# Patient Record
Sex: Female | Born: 1955 | Hispanic: Yes | Smoking: Never smoker
Health system: Southern US, Community
[De-identification: ages and names within clinical notes are randomized; demographics above are authoritative.]

## PROBLEM LIST (undated history)

## (undated) DIAGNOSIS — N200 Calculus of kidney: Secondary | ICD-10-CM

## (undated) DIAGNOSIS — K8020 Calculus of gallbladder without cholecystitis without obstruction: Secondary | ICD-10-CM

## (undated) DIAGNOSIS — G43909 Migraine, unspecified, not intractable, without status migrainosus: Secondary | ICD-10-CM

## (undated) HISTORY — DX: Calculus of gallbladder without cholecystitis without obstruction: K80.20

## (undated) HISTORY — DX: Calculus of kidney: N20.0

## (undated) HISTORY — DX: Migraine, unspecified, not intractable, without status migrainosus: G43.909

## (undated) HISTORY — PX: KIDNEY STONE SURGERY: SHX686

## (undated) HISTORY — PX: CHOLECYSTECTOMY: SHX55

## (undated) HISTORY — PX: LITHOTRIPSY: SUR834

## (undated) HISTORY — PX: BREAST SURGERY: SHX581

---

## 2019-06-22 ENCOUNTER — Other Ambulatory Visit: Payer: Self-pay

## 2019-06-23 ENCOUNTER — Ambulatory Visit (INDEPENDENT_AMBULATORY_CARE_PROVIDER_SITE_OTHER): Payer: BC Managed Care – PPO | Admitting: Family Medicine

## 2019-06-23 ENCOUNTER — Encounter: Payer: Self-pay | Admitting: Family Medicine

## 2019-06-23 VITALS — BP 126/79 | HR 87 | Temp 98.9°F | Ht 62.6 in | Wt 145.4 lb

## 2019-06-23 DIAGNOSIS — Z01411 Encounter for gynecological examination (general) (routine) with abnormal findings: Secondary | ICD-10-CM

## 2019-06-23 DIAGNOSIS — Z7689 Persons encountering health services in other specified circumstances: Secondary | ICD-10-CM

## 2019-06-23 DIAGNOSIS — Z124 Encounter for screening for malignant neoplasm of cervix: Secondary | ICD-10-CM | POA: Diagnosis not present

## 2019-06-23 DIAGNOSIS — Z01419 Encounter for gynecological examination (general) (routine) without abnormal findings: Secondary | ICD-10-CM

## 2019-06-23 DIAGNOSIS — Z114 Encounter for screening for human immunodeficiency virus [HIV]: Secondary | ICD-10-CM | POA: Diagnosis not present

## 2019-06-23 DIAGNOSIS — Z1321 Encounter for screening for nutritional disorder: Secondary | ICD-10-CM

## 2019-06-23 DIAGNOSIS — E663 Overweight: Secondary | ICD-10-CM

## 2019-06-23 DIAGNOSIS — Z1159 Encounter for screening for other viral diseases: Secondary | ICD-10-CM

## 2019-06-23 DIAGNOSIS — K089 Disorder of teeth and supporting structures, unspecified: Secondary | ICD-10-CM | POA: Diagnosis not present

## 2019-06-23 DIAGNOSIS — Z130 Encounter for screening for diseases of the blood and blood-forming organs and certain disorders involving the immune mechanism: Secondary | ICD-10-CM

## 2019-06-23 DIAGNOSIS — Z1211 Encounter for screening for malignant neoplasm of colon: Secondary | ICD-10-CM

## 2019-06-23 DIAGNOSIS — Z1231 Encounter for screening mammogram for malignant neoplasm of breast: Secondary | ICD-10-CM

## 2019-06-23 NOTE — Progress Notes (Signed)
Alyssa Walters is a 64 y.o. female presents to office today for annual physical exam examination.    In person [facility] Spanish interpreter Delfino Lovett present for today's exam.  Concerns today include:  Patient is an immigrant from [place].  She has not had any preventative care in several years now.  She has a history of migraine headaches as a juvenile but has not had any recurrent issues.  She does have a history of renal stones that have required lithotripsy in the past.  Additionally, last colonoscopy was greater than 5 years ago at that time was an incomplete colonoscopy due to difficulty passing through the bowel fully.  She would like to have this reordered.  Occupation: works in a factory in [place], Marital status: single, Substance use: none Diet: balanced, Exercise: active a work Last colonoscopy: >5 years ago in [place] Last mammogram: >5 years ago; had a cyst drained previously in left breast Last pap smear: >5 years ago, no abnormals Refills needed today: n/a Immunizations needed: unknown.    There is no immunization history on file for this patient.   Past Medical History:  Diagnosis Date  . Gallbladder stone without cholecystitis or obstruction   . Kidney stone   . Migraines   . Renal stone    Social History   Socioeconomic History  . Marital status: Divorced    Spouse name: Not on file  . Number of children: 5  . Years of education: Not on file  . Highest education level: Not on file  Occupational History  . Not on file  Tobacco Use  . Smoking status: Passive Smoke Exposure - Never Smoker  . Smokeless tobacco: Never Used  Substance and Sexual Activity  . Alcohol use: Yes    Alcohol/week: 1.0 standard drinks    Types: 1 Glasses of wine per week  . Drug use: Never  . Sexual activity: Not Currently  Other Topics Concern  . Not on file  Social History Narrative   Moved from [place].  Resides in [place].   Has 5 children, 1 lives in [place] and works as a  Tax adviser.   1 daughter in [place]   Social Determinants of Health   Financial Resource Strain:   . Difficulty of Paying Living Expenses:   Food Insecurity:   . Worried About Charity fundraiser in the Last Year:   . Arboriculturist in the Last Year:   Transportation Needs:   . Film/video editor (Medical):   Marland Kitchen Lack of Transportation (Non-Medical):   Physical Activity:   . Days of Exercise per Week:   . Minutes of Exercise per Session:   Stress:   . Feeling of Stress :   Social Connections:   . Frequency of Communication with Friends and Family:   . Frequency of Social Gatherings with Friends and Family:   . Attends Religious Services:   . Active Member of Clubs or Organizations:   . Attends Archivist Meetings:   Marland Kitchen Marital Status:   Intimate Partner Violence:   . Fear of Current or Ex-Partner:   . Emotionally Abused:   Marland Kitchen Physically Abused:   . Sexually Abused:    Past Surgical History:  Procedure Laterality Date  . BREAST SURGERY Left    cyst drained  . CESAREAN SECTION    . CHOLECYSTECTOMY    . LITHOTRIPSY     Family History  Problem Relation Age of Onset  . Cancer Mother   .  Arthritis Mother   . Kidney disease Father   . Cancer Sister   . Clotting disorder Brother    No current outpatient medications on file.  No Known Allergies   ROS: Review of Systems Constitutional: negative Eyes: negative Ears, nose, mouth, throat, and face: negative Respiratory: negative Cardiovascular: negative Gastrointestinal: negative Genitourinary:external vaginal itching, admits to shaving Integument/breast: has spider veins legs, discoloration along RUQ Hematologic/lymphatic: negative Musculoskeletal:negative Neurological: negative Behavioral/Psych: negative Endocrine: negative Allergic/Immunologic: negative    Physical exam BP 126/79   Pulse 87   Temp 98.9 F (37.2 C) (Temporal)   Ht 5' 2.6" (1.59 m)   Wt 145 lb 6.4 oz (66 kg)   SpO2  96%   BMI 26.09 kg/m  General appearance: alert, cooperative, appears stated age and no distress Head: Normocephalic, without obvious abnormality, atraumatic Eyes: negative findings: lids and lashes normal and conjunctivae and sclerae normal Ears: normal TM's and external ear canals both ears Nose: Nares normal. Septum midline. Mucosa normal. No drainage or sinus tenderness. Throat: lips, mucosa, and tongue normal; poor dentition Neck: no adenopathy, supple, symmetrical, trachea midline and thyroid not enlarged, symmetric, no tenderness/mass/nodules Back: symmetric, no curvature. ROM normal. No CVA tenderness. Lungs: clear to auscultation bilaterally and normal WOB on room air Heart: regular rate and rhythm, S1, S2 normal, no murmur, click, rub or gallop Abdomen: soft, non-tender; bowel sounds normal; no masses,  no organomegaly Extremities: extremities normal, atraumatic, no cyanosis or edema Pulses: 2+ and symmetric Skin: She does have slight hyperpigmentation noted in the right upper quadrant of the abdomen.  Small varicose veins noted throughout bilateral lower extremities up to thighs. Lymph nodes: Cervical, supraclavicular, and axillary nodes normal. Neurologic: Alert and oriented X 3, normal strength and tone. Normal symmetric reflexes. Normal coordination and gait  Psych: Mood stable, speech normal, affect appropriate, pleasant and interactive. Depression screen [facility] 2/9 06/23/2019  Decreased Interest 0  Down, Depressed, Hopeless 0  PHQ - 2 Score 0  Altered sleeping 1  Tired, decreased energy 1  Change in appetite 0  Feeling bad or failure about yourself  2  Trouble concentrating 0  Moving slowly or fidgety/restless 0  Suicidal thoughts 0  PHQ-9 Score 4    Assessment/ Plan: [name] here for annual physical exam.   1. Well woman exam with routine gynecological exam  2. Poor dentition Seeing dentistry  3. Establishing care with new doctor, encounter for No previous  records available for review  4. Overweight (BMI 25.0-29.9) nonfasting labs ordered - CMP14+EGFR - TSH - Lipid Panel  5. Screening for cervical cancer - Pap IG, CT/NG NAA, and HPV (high risk) [facility]  6. Screening, anemia, deficiency, iron - CBC  7. Screening for HIV (human immunodeficiency virus) - HIV antibody (with reflex)  8. Encounter for hepatitis C screening test for low risk patient - Hepatitis C antibody  9. Encounter for vitamin deficiency screening - VITAMIN D 25 Hydroxy (Vit-D Deficiency, Fractures)  10. Screen for colon cancer - Ambulatory referral to Gastroenterology  11. Encounter for screening mammogram for malignant neoplasm of breast - MM Digital Screening; Future   Counseled on healthy lifestyle choices, including diet (rich in fruits, vegetables and lean meats and low in salt and simple carbohydrates) and exercise (at least 30 minutes of moderate physical activity daily).  Total time spent with patient 58 minutes.  Greater than 50% of encounter spent in coordination of care/counseling.   Maryori Weide M. Lajuana Ripple, DO

## 2019-06-23 NOTE — Patient Instructions (Signed)
You had labs performed today.  You will be contacted with the results of the labs once they are available, usually in the next 3 business days for routine lab work.  If you have an active my chart account, they will be released to your MyChart.  If you prefer to have these labs released to you via telephone, please let us know.  If you had a pap smear or biopsy performed, expect to be contacted in about 7-10 days.   Cuidados preventivos en las mujeres de 13 a 32 aos de edad Preventive Care 64-91 Years Old, Female Los cuidados preventivos hacen referencia a las opciones en cuanto a las visitas al mdico y al estilo de vida, las cuales pueden promover la salud y Musician. Esto puede comprender lo siguiente:  Un examen fsico anual. Esto tambin se puede llamar control de bienestar anual.  Visitas regulares al dentista y exmenes oculares.  Vacunas.  Estudios para Engineer, building services.  Opciones saludables de estilo de vida, como seguir una dieta saludable, hacer ejercicio regularmente, no usar drogas ni productos que contengan nicotina y tabaco, y limitar el consumo de alcohol. Qu puedo esperar para mi visita de cuidado preventivo? Examen fsico El mdico revisar lo siguiente:  IT consultant y [place]. Esto se puede usar para calcular el ndice de masa corporal ([place]), que indica si tiene un peso saludable.  Frecuencia cardaca y presin arterial.  Piel para detectar manchas anormales. Asesoramiento Su mdico puede preguntarle acerca de:  Consumo de tabaco, alcohol y drogas.  Su bienestar emocional.  El bienestar en el hogar y sus relaciones personales.  Su actividad sexual.  Sus hbitos de alimentacin.  Su trabajo y [place] laboral.  Mtodos anticonceptivos.  Su ciclo menstrual.  Sus antecedentes de Media planner. Qu vacunas necesito?  [place] antigripal  Se recomienda aplicarse esta vacuna todos los [place]. Vacuna contra el ttanos, difteria y tos ferina  (Tdap)  Es posible que tenga que aplicarse un refuerzo contra el ttanos y la difteria (DT) cada 10aos. Vacuna contra la varicela  Es posible que tenga que aplicrsela si no recibi esta vacuna. Vacuna contra el herpes zster (culebrilla)  Es posible que la necesite despus de los 87 aos de [place]. Vacuna contra el sarampin, rubola y paperas (SRP)  Es posible que necesite aplicarse al menos una dosis de la vacuna SRP si naci despus de [id number]. Podra tambin necesitar una segunda dosis. Vacuna antineumoccica conjugada (PCV13)  Puede necesitar esta vacuna si tiene determinadas enfermedades y no se vacun anteriormente. [place] antineumoccica de polisacridos (PPSV23)  Quizs tenga que aplicarse una o dos dosis si fuma o si tiene determinadas afecciones. [place] antimeningoccica conjugada (MenACWY)  Puede necesitar esta vacuna si tiene determinadas afecciones. Vacuna contra la hepatitis A  Es posible que necesite esta vacuna si tiene ciertas afecciones o si viaja o trabaja en lugares en los que podra estar expuesta a la hepatitis A. Vacuna contra la hepatitis B  Es posible que necesite esta vacuna si tiene ciertas afecciones o si viaja o trabaja en lugares en los que podra estar expuesta a la hepatitis B. Vacuna antihaemophilus influenzae tipo B (Hib)  Puede necesitar esta vacuna si tiene determinadas afecciones. Vacuna contra el virus del papiloma humano (VPH)  Si el mdico se lo recomienda, Research scientist (physical sciences) tres dosis a lo largo de 6 meses. Puede recibir las vacunas en forma de dosis individuales o en forma de dos o ms vacunas juntas en la misma inyeccin (vacunas combinadas). Hable con su mdico  sobre los riesgos y [place] vacunas combinadas. Qu pruebas necesito? Anlisis de [employer] de lpidos y colesterol. Estos se pueden verificar cada 64 aos o, con ms frecuencia, si usted tiene ms de 11 aos de edad.  Anlisis de hepatitisC.  Anlisis de  hepatitisB. Pruebas de deteccin  Pruebas de deteccin de cncer de pulmn. Es posible que se le realice esta prueba de deteccin a partir de los 64 aos de edad, si ha fumado durante 30 aos un paquete diario y sigue fumando o dej el hbito en algn momento en los ltimos 15 aos.  Pruebas de Programme researcher, broadcasting/film/video de Surveyor, minerals. Todos los adultos a partir de los 64 aos de edad y [place] 55 aos de edad deben hacerse esta prueba de deteccin. El mdico puede recomendarle las pruebas de deteccin a partir de los 86 aos de edad si corre un mayor riesgo. Le realizarn pruebas cada 1 a 64 aos, segn los [place] y el tipo de prueba de Programme researcher, broadcasting/film/video.  Pruebas de deteccin de la diabetes. Esto se Set designer un control del azcar en la sangre (glucosa) despus de no haber comido durante un periodo de tiempo (ayuno). Es posible que se le realice esta prueba cada 1 a 3 aos.  Mamografa. Se puede realizar cada 1 o 2 aos. Hable con su mdico sobre cundo debe comenzar a Engineer, manufacturing de [place] regular. Esto depende de si tiene antecedentes familiares de cncer de mama o no.  Pruebas de deteccin de cncer relacionado con las mutaciones del BRCA. Es posible que se las deba realizar si tiene antecedentes de cncer de mama, de ovario, de trompas o peritoneal.  Examen plvico y prueba de Papanicolaou. Esto se puede realizar cada 34aos a Renato Gails de los 21aos de edad. A partir de los 30 aos, esto se puede Optometrist cada 64 aos si usted se realiza una prueba de Papanicolaou en combinacin con una prueba de deteccin del virus del papiloma humano (VPH). Otras pruebas  Anlisis de enfermedades de transmisin sexual (ETS).  Densitometra sea. Esto se realiza para detectar osteoporosis. Se le puede realizar este examen de deteccin si tiene un riesgo alto de tener osteoporosis. Siga estas instrucciones en su casa: Comida y bebida  Siga una dieta que incluya frutas frescas y verduras, cereales  integrales, lcteos descremados y protenas magras.  Tome los suplementos vitamnicos y [employer] se lo haya indicado el mdico.  No beba alcohol si: ? Su mdico le indica no hacerlo. ? Est embarazada, puede estar embarazada o est tratando de quedar embarazada.  Si bebe alcohol: ? Limite la cantidad que consume de 0 a 1 medida por da. ? Est atenta a la cantidad de alcohol que hay en las bebidas que toma. En los [place], una medida equivale a una botella de cerveza de 12oz (346m), un vaso de vino de 5oz (1459m o un vaso de una bebida alcohlica de alta graduacin de 1oz (4455m Estilo de vidNavistar International Corporationlas encas a diario.  Mantngase activa. Haga al menos 41m45mos de ejercicio 5o ms das Hilton Hotelso consuma ningn producto que contenga nicotina o tabaco, como cigarrillos, cigarrillos electrnicos y tabaco de mascHigher education careers adviser necesita ayuda para dejar de fumar, consulte al mdico.  Si es sexualmente activa, practique sexo seguro. Use un condn u otra forma de mtodo anticonceptivo (anticonceptivos) a fin de evitEnvironmental health practitionerTS (infecciones de transmisin sexual).  Si el mdico se lo indic, tome una  dosis baja de aspirina diariamente a partir de los 73 aos de [place]. Cundo volver?  Visite al mdico una vez al ao para una visita de control.  Pregntele al mdico con qu frecuencia debe realizarse un control de la vista y los dientes.  Mantenga su esquema de vacunacin al da. Esta informacin no tiene Marine scientist el consejo del mdico. Asegrese de hacerle al mdico cualquier pregunta que tenga. Document Revised: 01/13/2018 Document Reviewed: 01/13/2018 Elsevier Patient Education  [place].

## 2019-06-24 LAB — LIPID PANEL
Chol/HDL Ratio: 3.2 ratio (ref 0.0–4.4)
Cholesterol, Total: 189 mg/dL (ref 100–199)
HDL: 60 mg/dL (ref >39)
LDL Chol Calc (NIH): 105 mg/dL — ABNORMAL HIGH (ref 0–99)
Triglycerides: 139 mg/dL (ref 0–149)
VLDL Cholesterol Cal: 24 mg/dL (ref 5–40)

## 2019-06-24 LAB — CMP14+EGFR
ALT: 18 IU/L (ref 0–32)
AST: 22 IU/L (ref 0–40)
Albumin/Globulin Ratio: 1.8 (ref 1.2–2.2)
Albumin: 4.8 g/dL (ref 3.8–4.8)
Alkaline Phosphatase: 111 IU/L (ref 39–117)
BUN/Creatinine Ratio: 12 (ref 12–28)
BUN: 12 mg/dL (ref 8–27)
Bilirubin Total: 0.3 mg/dL (ref 0.0–1.2)
CO2: 24 mmol/L (ref 20–29)
Calcium: 9.9 mg/dL (ref 8.7–10.3)
Chloride: 103 mmol/L (ref 96–106)
Creatinine, Ser: 1.01 mg/dL — ABNORMAL HIGH (ref 0.57–1.00)
GFR calc Af Amer: 68 mL/min/{1.73_m2} (ref >59)
GFR calc non Af Amer: 59 mL/min/{1.73_m2} — ABNORMAL LOW (ref >59)
Globulin, Total: 2.6 g/dL (ref 1.5–4.5)
Glucose: 94 mg/dL (ref 65–99)
Potassium: 4 mmol/L (ref 3.5–5.2)
Sodium: 142 mmol/L (ref 134–144)
Total Protein: 7.4 g/dL (ref 6.0–8.5)

## 2019-06-24 LAB — HIV ANTIBODY (ROUTINE TESTING W REFLEX): HIV Screen 4th Generation wRfx: NONREACTIVE

## 2019-06-24 LAB — CBC
Hematocrit: 40.7 % (ref 34.0–46.6)
Hemoglobin: 13.4 g/dL (ref 11.1–15.9)
MCH: 27.9 pg (ref 26.6–33.0)
MCHC: 32.9 g/dL (ref 31.5–35.7)
MCV: 85 fL (ref 79–97)
Platelets: 284 10*3/uL (ref 150–450)
RBC: 4.81 x10E6/uL (ref 3.77–5.28)
RDW: 13 % (ref 11.7–15.4)
WBC: 6.1 10*3/uL (ref 3.4–10.8)

## 2019-06-24 LAB — HEPATITIS C ANTIBODY: Hep C Virus Ab: <0.1 s/co ratio (ref 0.0–0.9)

## 2019-06-24 LAB — VITAMIN D 25 HYDROXY (VIT D DEFICIENCY, FRACTURES): Vit D, 25-Hydroxy: 31.3 ng/mL (ref 30.0–100.0)

## 2019-06-24 LAB — TSH: TSH: 1.32 u[IU]/mL (ref 0.450–4.500)

## 2019-06-25 ENCOUNTER — Encounter: Payer: Self-pay | Admitting: Family Medicine

## 2019-06-27 LAB — IGP,CTNG,APTIMAHPV
Chlamydia, Nuc. Acid Amp: NEGATIVE
Gonococcus by Nucleic Acid Amp: NEGATIVE
HPV Aptima: NEGATIVE

## 2019-06-28 NOTE — Progress Notes (Signed)
Lmtcb.

## 2019-06-29 ENCOUNTER — Telehealth: Payer: Self-pay | Admitting: Family Medicine

## 2019-06-29 NOTE — Telephone Encounter (Signed)
Is it alright to schedule a work in appt?

## 2019-06-29 NOTE — Telephone Encounter (Signed)
Yes. Totally ok to schedule.  Unfortunately her appointment ran almost an hour and she had other issues were a higher priority so absolutely glad to take care of this if she would like to come in.

## 2019-06-30 NOTE — Telephone Encounter (Signed)
Message left on voice mail.   Patient needs a follow up appointment scheduled to have ears cleaned.  Please call to schedule.

## 2019-07-03 ENCOUNTER — Encounter: Payer: Self-pay | Admitting: Internal Medicine

## 2019-07-17 ENCOUNTER — Ambulatory Visit (INDEPENDENT_AMBULATORY_CARE_PROVIDER_SITE_OTHER): Payer: BC Managed Care – PPO | Admitting: Family Medicine

## 2019-07-17 ENCOUNTER — Encounter: Payer: Self-pay | Admitting: Family Medicine

## 2019-07-17 ENCOUNTER — Other Ambulatory Visit: Payer: Self-pay

## 2019-07-17 VITALS — BP 129/88 | HR 76 | Temp 97.5°F | Ht 63.0 in | Wt 146.0 lb

## 2019-07-17 DIAGNOSIS — H9313 Tinnitus, bilateral: Secondary | ICD-10-CM

## 2019-07-17 DIAGNOSIS — M25511 Pain in right shoulder: Secondary | ICD-10-CM

## 2019-07-17 DIAGNOSIS — R7989 Other specified abnormal findings of blood chemistry: Secondary | ICD-10-CM

## 2019-07-17 DIAGNOSIS — R21 Rash and other nonspecific skin eruption: Secondary | ICD-10-CM

## 2019-07-17 DIAGNOSIS — G8929 Other chronic pain: Secondary | ICD-10-CM

## 2019-07-17 DIAGNOSIS — M25512 Pain in left shoulder: Secondary | ICD-10-CM

## 2019-07-17 DIAGNOSIS — R682 Dry mouth, unspecified: Secondary | ICD-10-CM

## 2019-07-17 MED ORDER — TRIAMCINOLONE ACETONIDE 0.1 % EX CREA: 1.0000 "application " | TOPICAL_CREAM | Freq: Two times a day (BID) | CUTANEOUS | 0 refills | Status: DC

## 2019-07-17 NOTE — Patient Instructions (Signed)
Asegrese de llegar al menos 15 minutos antes de su visita programada.  Tus odos lucan limpios hoy. Me preocupa que sus cambios de audicin estn relacionados con la necesidad de una mejor proteccin auditiva en [place].   Su mareo y sequedad de boca pueden deberse a una ingesta insuficiente de agua. Hoy voy a volver a Building surveyor funcin renal.  Tambin puede usar caramelos cidos y Biotene (en el pasillo de enjuagues bucales de la tienda) para ayudar con la boca seca.   Para su erupcin, aplique Triamcinolone dos veces al [employer] la erupcin durante 1 [place]. Luego se detiene.   Para el dolor en las articulaciones, aplique hielo en las reas afectadas, use Tylenol o Ibuprofeno para ayudar.

## 2019-07-17 NOTE — Progress Notes (Signed)
Subjective: CC: ears PCP: Alyssa Norlander, DO [id number] [name] is a 64 y.o. female presenting to clinic today for:  [employer] video interpreter Katharine Look, [place] used for Spanish translation of this visit   1.  Ringing in ears/decreased hearing Patient is here for ear cleanout.  She was noted to have cerumen bilaterally during her last visit but unfortunately her visit was unable to accommodate a cleanout.  She has not yet changed to the over the ear hearing protection that was recommended at last visit.  She reports inability to tolerate the inner ear hearing protection.  She sometimes feels dizzy when she wakes up in the morning and also complains of a dry mouth.  She wonders if she is dehydrated.  She goes on to state that she also has bilateral shoulder and rib pain.  She works overhead at work.  She has repetitive movements at work.  Not currently using any therapies but wonders if there is something that she should do.  She also notes that when she lays on her side at nighttime sometimes she will get some numbness in her thigh.  She was wanting to know if there is any therapies that she should be doing for this.    She also asks about her labs.  She had her son look at them online and told her that most everything looked pretty good except her cholesterol being elevated.  She never received a call for interpretation of her labs.  She wants to have her kidney function checked again because she has had surgeries on her kidneys in the past.  She reports good urine output.  Does not report any dysuria, hematuria.  She has history of renal stones status post surgical resection of renal stones previously.  She notes a rash on the right forearm that has come up over the last several days.  She does note that it somewhat itchy.  No therapies used.  ROS: Per HPI  No Known Allergies Past Medical History:  Diagnosis Date  . Gallbladder stone without cholecystitis or obstruction   . Kidney  stone   . Migraines   . Renal stone     Current Outpatient Medications:  .  triamcinolone cream (KENALOG) 0.1 %, Apply 1 application topically 2 (two) times daily. x7-10 days, Disp: 30 g, Rfl: 0 Social History   Socioeconomic History  . Marital status: Divorced    Spouse name: Not on file  . Number of children: 5  . Years of education: Not on file  . Highest education level: Not on file  Occupational History  . Not on file  Tobacco Use  . Smoking status: Passive Smoke Exposure - Never Smoker  . Smokeless tobacco: Never Used  Substance and Sexual Activity  . Alcohol use: Yes    Alcohol/week: 1.0 standard drinks    Types: 1 Glasses of wine per week  . Drug use: Never  . Sexual activity: Not Currently  Other Topics Concern  . Not on file  Social History Narrative   Moved from [place].  Resides in [place].   Has 5 children, 1 lives in [place] and works as a Tax adviser.   1 daughter in [place]   Social Determinants of Health   Financial Resource Strain:   . Difficulty of Paying Living Expenses:   Food Insecurity:   . Worried About Charity fundraiser in the Last Year:   . Arboriculturist in the Last Year:   Transportation Needs:   .  Lack of Transportation (Medical):   Marland Kitchen Lack of Transportation (Non-Medical):   Physical Activity:   . Days of Exercise per Week:   . Minutes of Exercise per Session:   Stress:   . Feeling of Stress :   Social Connections:   . Frequency of Communication with Friends and Family:   . Frequency of Social Gatherings with Friends and Family:   . Attends Religious Services:   . Active Member of Clubs or Organizations:   . Attends Archivist Meetings:   Marland Kitchen Marital Status:   Intimate Partner Violence:   . Fear of Current or Ex-Partner:   . Emotionally Abused:   Marland Kitchen Physically Abused:   . Sexually Abused:    Family History  Problem Relation Age of Onset  . Cancer Mother   . Arthritis Mother   . Kidney disease Father   .  Cancer Sister   . Clotting disorder Brother     Objective: Office vital signs reviewed. BP 129/88   Pulse 76   Temp (!) 97.5 F (36.4 C)   Ht 5\' 3"  (1.6 m)   Wt 146 lb (66.2 kg)   SpO2 97%   BMI 25.86 kg/m   Physical Examination:  General: Awake, alert, well nourished, No acute distress HEENT: Normal; TMs intact bilaterally.  Good light reflex.  No cerumen within the auditory canals today.  No erythema. Cardio: regular rate and rhythm, S1S2 heard, no murmurs appreciated Pulm: clear to auscultation bilaterally, no wheezes, rhonchi or rales; normal work of breathing on room air Extremities: warm, well perfused, No edema, cyanosis or clubbing; +2 pulses bilaterally MSK: normal gait and station; has full active range of motion of bilateral upper extremities.  No visible deformities. Skin: dry; intact; she has a small area hyperpigmentation that is annular noted on the right forearm.  No skin breakdown Neuro: No focal neurologic deficits  Assessment/ Plan: 64 y.o. female   1. Elevated serum creatinine Uncertain etiology. Apparently, there is a h/o renal stone surgery in past.  Not having flank pain.  Possibly 2/2 dehydration as has dry mouth.  Recheck renal function. - Basic Metabolic Panel  2. Tinnitus of both ears Likely secondary to inability to tolerate ear protection at home.  I again reinforced need for over ear hearing protection to reduce risk of damage to the eardrums and early hearing loss.  If she continues to have symptoms, low threshold to have her evaluated by ENT.  3. Rash and nonspecific skin eruption Triamcinolone cream prescribed.  She will apply to affected area twice daily for the next 7 days.  4. Chronic pain of both shoulders We discussed use of Tylenol and stretching.  This is likely secondary to overuse in her workplace.  Unfortunately, she has to work and cannot modify activity at this time.  If continues to be an issue, low threshold to have her evaluated  by orthopedics for possible injection therapy  5. Dry mouth Biotene.  Sialagogues and sour candies recommended.  Increase water intake.   Orders Placed This Encounter  Procedures  . Basic Metabolic Panel   Meds ordered this encounter  Medications  . triamcinolone cream (KENALOG) 0.1 %    Sig: Apply 1 application topically 2 (two) times daily. x7-10 days    Dispense:  30 g    Refill:  0     Alyssa Bungert Windell Moulding, DO [place] [phone]

## 2019-07-18 LAB — BASIC METABOLIC PANEL
BUN/Creatinine Ratio: 13 (ref 12–28)
BUN: 12 mg/dL (ref 8–27)
CO2: 23 mmol/L (ref 20–29)
Calcium: 10.6 mg/dL — ABNORMAL HIGH (ref 8.7–10.3)
Chloride: 103 mmol/L (ref 96–106)
Creatinine, Ser: 0.89 mg/dL (ref 0.57–1.00)
GFR calc Af Amer: 80 mL/min/{1.73_m2} (ref >59)
GFR calc non Af Amer: 69 mL/min/{1.73_m2} (ref >59)
Glucose: 90 mg/dL (ref 65–99)
Potassium: 4.4 mmol/L (ref 3.5–5.2)
Sodium: 143 mmol/L (ref 134–144)

## 2019-07-21 ENCOUNTER — Ambulatory Visit: Payer: Self-pay | Admitting: Family Medicine

## 2019-08-09 ENCOUNTER — Ambulatory Visit (HOSPITAL_COMMUNITY)
Admission: RE | Admit: 2019-08-09 | Discharge: 2019-08-09 | Disposition: A | Discharge status: Home / Self Care | Payer: BC Managed Care – PPO | Source: Ambulatory Visit | Attending: Family Medicine | Admitting: Family Medicine

## 2019-08-09 ENCOUNTER — Ambulatory Visit (HOSPITAL_COMMUNITY): Payer: BC Managed Care – PPO

## 2019-08-09 ENCOUNTER — Other Ambulatory Visit: Payer: Self-pay

## 2019-08-09 DIAGNOSIS — Z1231 Encounter for screening mammogram for malignant neoplasm of breast: Secondary | ICD-10-CM | POA: Diagnosis not present

## 2019-08-14 ENCOUNTER — Ambulatory Visit: Payer: BC Managed Care – PPO

## 2019-10-05 DIAGNOSIS — Z23 Encounter for immunization: Secondary | ICD-10-CM | POA: Diagnosis not present

## 2019-10-11 ENCOUNTER — Inpatient Hospital Stay: Admission: RE | Admit: 2019-10-11 | Payer: BC Managed Care – PPO | Source: Ambulatory Visit

## 2019-10-27 DIAGNOSIS — Z23 Encounter for immunization: Secondary | ICD-10-CM | POA: Diagnosis not present

## 2019-11-06 ENCOUNTER — Encounter: Payer: Self-pay | Admitting: Internal Medicine

## 2019-11-06 ENCOUNTER — Other Ambulatory Visit: Payer: Self-pay

## 2019-11-06 ENCOUNTER — Ambulatory Visit: Payer: Self-pay

## 2020-02-12 ENCOUNTER — Emergency Department (HOSPITAL_COMMUNITY)
Admission: EM | Admit: 2020-02-12 | Discharge: 2020-02-12 | Disposition: A | Discharge status: Home / Self Care | Payer: BC Managed Care – PPO | Attending: Emergency Medicine | Admitting: Emergency Medicine

## 2020-02-12 ENCOUNTER — Other Ambulatory Visit: Payer: Self-pay

## 2020-02-12 ENCOUNTER — Encounter (HOSPITAL_COMMUNITY): Payer: Self-pay | Admitting: Emergency Medicine

## 2020-02-12 DIAGNOSIS — R110 Nausea: Secondary | ICD-10-CM | POA: Insufficient documentation

## 2020-02-12 DIAGNOSIS — R109 Unspecified abdominal pain: Secondary | ICD-10-CM | POA: Diagnosis not present

## 2020-02-12 DIAGNOSIS — Z5321 Procedure and treatment not carried out due to patient leaving prior to being seen by health care provider: Secondary | ICD-10-CM | POA: Insufficient documentation

## 2020-02-12 NOTE — ED Triage Notes (Signed)
Pt c/o right sided flank pain and nausea.

## 2020-02-13 ENCOUNTER — Encounter: Payer: Self-pay | Admitting: Nurse Practitioner

## 2020-02-13 ENCOUNTER — Ambulatory Visit (INDEPENDENT_AMBULATORY_CARE_PROVIDER_SITE_OTHER): Payer: BC Managed Care – PPO | Admitting: Nurse Practitioner

## 2020-02-13 ENCOUNTER — Ambulatory Visit (INDEPENDENT_AMBULATORY_CARE_PROVIDER_SITE_OTHER): Payer: BC Managed Care – PPO

## 2020-02-13 VITALS — BP 128/88 | HR 81 | Temp 97.5°F | Ht 63.0 in | Wt 145.4 lb

## 2020-02-13 DIAGNOSIS — R110 Nausea: Secondary | ICD-10-CM | POA: Insufficient documentation

## 2020-02-13 DIAGNOSIS — R1031 Right lower quadrant pain: Secondary | ICD-10-CM | POA: Insufficient documentation

## 2020-02-13 DIAGNOSIS — R109 Unspecified abdominal pain: Secondary | ICD-10-CM | POA: Diagnosis not present

## 2020-02-13 LAB — URINALYSIS, MICROSCOPIC ONLY
Bacteria, UA: NONE SEEN
Epithelial Cells (non renal): NONE SEEN /hpf (ref 0–10)
RBC, Urine: NONE SEEN /hpf (ref 0–2)
WBC, UA: NONE SEEN /hpf (ref 0–5)

## 2020-02-13 MED ORDER — ONDANSETRON HCL 4 MG PO TABS: 4.0000 mg | ORAL_TABLET | Freq: Three times a day (TID) | ORAL | 0 refills | Status: DC | PRN

## 2020-02-13 NOTE — Assessment & Plan Note (Signed)
Pain not well managed.  KUB ordered, CT ordered, Tylenol as needed for pain.  Zofran for nausea. Results pending Medication sent to pharmacy. Follow-up with worsening or unresolved symptoms.

## 2020-02-13 NOTE — Addendum Note (Signed)
Addended by: Lanier Prude D on: 02/13/2020 03:09 PM   Modules accepted: Orders

## 2020-02-13 NOTE — Patient Instructions (Signed)
Nausea, Adult Nausea is feeling sick to your stomach or feeling that you are about to throw up (vomit). Feeling sick to your stomach is usually not serious, but it may be an early sign of a more serious medical problem. As you feel sicker to your stomach, you may throw up. If you throw up, or if you are not able to drink enough fluids, there is a risk that you may lose too much water in your body (get dehydrated). If you lose too much water in your body, you may:  Feel tired.  Feel thirsty.  Have a dry mouth.  Have cracked lips.  Go pee (urinate) less often. Older adults and people who have other diseases or a weak body defense system (immune system) have a higher risk of losing too much water in the body. The main goals of treating this condition are:  To relieve your nausea.  To ensure your nausea occurs less often.  To prevent throwing up and losing too much fluid. Follow these instructions at home: Watch your symptoms for any changes. Tell your doctor about them. Follow these instructions as told by your doctor. Eating and drinking      Take an ORS (oral rehydration solution). This is a drink that is sold at pharmacies and stores.  Drink clear fluids in small amounts as you are able. These include: ? Water. ? Ice chips. ? Fruit juice that has water added (diluted fruit juice). ? Low-calorie sports drinks.  Eat bland, easy-to-digest foods in small amounts as you are able, such as: ? Bananas. ? Applesauce. ? Rice. ? Low-fat (lean) meats. ? Toast. ? Crackers.  Avoid drinking fluids that have a lot of sugar or caffeine in them. This includes energy drinks, sports drinks, and soda.  Avoid alcohol.  Avoid spicy or fatty foods. General instructions  Take over-the-counter and prescription medicines only as told by your doctor.  Rest at home while you get better.  Drink enough fluid to keep your pee (urine) pale yellow.  Take slow and deep breaths when you  feel sick to your stomach.  Avoid food or things that have strong smells.  Wash your hands often with soap and water. If you cannot use soap and water, use hand sanitizer.  Make sure that all people in your home wash their hands well and often.  Keep all follow-up visits as told by your doctor. This is important. Contact a doctor if:  You feel sicker to your stomach.  You feel sick to your stomach for more than 2 days.  You throw up.  You are not able to drink fluids without throwing up.  You have new symptoms.  You have a fever.  You have a headache.  You have muscle cramps.  You have a rash.  You have pain while peeing.  You feel light-headed or dizzy. Get help right away if:  You have pain in your chest, neck, arm, or jaw.  You feel very weak or you pass out (faint).  You have throw up that is bright red or looks like coffee grounds.  You have bloody or black poop (stools) or poop that looks like tar.  You have a very bad headache, a stiff neck, or both.  You have very bad pain, cramping, or bloating in your belly (abdomen).  You have trouble breathing or you are breathing very quickly.  Your heart is beating very quickly.  Your skin feels cold and clammy.  You feel  confused.  You have signs of losing too much water in your body, such as: ? Dark pee, very little pee, or no pee. ? Cracked lips. ? Dry mouth. ? Sunken eyes. ? Sleepiness. ? Weakness. These symptoms may be an emergency. Do not wait to see if the symptoms will go away. Get medical help right away. Call your local emergency services (911 in the [place] Do not drive yourself to the hospital. Summary  Nausea is feeling sick to your stomach or feeling that you are about to throw up (vomit).  If you throw up, or if you are not able to drink enough fluids, there is a risk that you may lose too much water in your body (get dehydrated).  Eat and drink what your doctor tells you. Take  over-the-counter and prescription medicines only as told by your doctor.  Contact a doctor right away if your symptoms get worse or you have new symptoms.  Keep all follow-up visits as told by your doctor. This is important. This information is not intended to replace advice given to you by your health care provider. Make sure you discuss any questions you have with your health care provider. Document Revised: 09/07/2017 Document Reviewed: 09/07/2017 Elsevier Patient Education  [place]. Abdominal Pain, Adult Many things can cause belly (abdominal) pain. Most times, belly pain is not dangerous. Many cases of belly pain can be watched and treated at home. Sometimes, though, belly pain is serious. Your doctor will try to find the cause of your belly pain. Follow these instructions at home:  Medicines  Take over-the-counter and prescription medicines only as told by your doctor.  Do not take medicines that help you poop (laxatives) unless told by your doctor. General instructions  Watch your belly pain for any changes.  Drink enough fluid to keep your pee (urine) pale yellow.  Keep all follow-up visits as told by your doctor. This is important. Contact a doctor if:  Your belly pain changes or gets worse.  You are not hungry, or you lose weight without trying.  You are having trouble pooping (constipated) or have watery poop (diarrhea) for more than 2-3 days.  You have pain when you pee or poop.  Your belly pain wakes you up at night.  Your pain gets worse with meals, after eating, or with certain foods.  You are vomiting and cannot keep anything down.  You have a fever.  You have blood in your pee. Get help right away if:  Your pain does not go away as soon as your doctor says it should.  You cannot stop vomiting.  Your pain is only in areas of your belly, such as the right side or the left lower part of the belly.  You have bloody or black poop, or poop that  looks like tar.  You have very bad pain, cramping, or bloating in your belly.  You have signs of not having enough fluid or water in your body (dehydration), such as: ? Dark pee, very little pee, or no pee. ? Cracked lips. ? Dry mouth. ? Sunken eyes. ? Sleepiness. ? Weakness.  You have trouble breathing or chest pain. Summary  Many cases of belly pain can be watched and treated at home.  Watch your belly pain for any changes.  Take over-the-counter and prescription medicines only as told by your doctor.  Contact a doctor if your belly pain changes or gets worse.  Get help right away if you have very  bad pain, cramping, or bloating in your belly. This information is not intended to replace advice given to you by your health care provider. Make sure you discuss any questions you have with your health care provider. Document Revised: 08/08/2018 Document Reviewed: 08/08/2018 Elsevier Patient Education  [place].

## 2020-02-13 NOTE — Progress Notes (Signed)
Acute Office Visit  Subjective:    Patient ID: Alyssa Walters, female    DOB: 1955/11/28, 64 y.o.   MRN: [id number]  Chief Complaint  Patient presents with  . Abdominal Pain    Patient states she is having right sided abd pain and nausea that has been going on for 5 days.  . Nausea    Abdominal Pain This is a new problem. The current episode started in the past 7 days. The onset quality is gradual. The problem occurs constantly. The problem has been gradually worsening. The pain is located in the RLQ. The pain is at a severity of 7/10. The quality of the pain is aching. The abdominal pain radiates to the right flank. Pertinent negatives include no dysuria, fever or hematuria. The pain is aggravated by palpation. The pain is relieved by nothing. She has tried nothing for the symptoms. Her past medical history is significant for gallstones.    Past Medical History:  Diagnosis Date  . Gallbladder stone without cholecystitis or obstruction   . Kidney stone   . Migraines   . Renal stone     Past Surgical History:  Procedure Laterality Date  . BREAST SURGERY Left    cyst drained  . CESAREAN SECTION    . CHOLECYSTECTOMY    . LITHOTRIPSY      Family History  Problem Relation Age of Onset  . Cancer Mother   . Arthritis Mother   . Kidney disease Father   . Cancer Sister   . Clotting disorder Brother     Social History   Socioeconomic History  . Marital status: Divorced    Spouse name: Not on file  . Number of children: 5  . Years of education: Not on file  . Highest education level: Not on file  Occupational History  . Not on file  Tobacco Use  . Smoking status: Passive Smoke Exposure - Never Smoker  . Smokeless tobacco: Never Used  Vaping Use  . Vaping Use: Never used  Substance and Sexual Activity  . Alcohol use: Yes    Alcohol/week: 1.0 standard drink    Types: 1 Glasses of wine per week  . Drug use: Never  . Sexual activity: Not Currently  Other Topics Concern   . Not on file  Social History Narrative   Moved from [place].  Resides in [place].   Has 5 children, 1 lives in [place] and works as a Tax adviser.   1 daughter in [place]   Social Determinants of Health   Financial Resource Strain:   . Difficulty of Paying Living Expenses: Not on file  Food Insecurity:   . Worried About Charity fundraiser in the Last Year: Not on file  . Ran Out of Food in the Last Year: Not on file  Transportation Needs:   . Lack of Transportation (Medical): Not on file  . Lack of Transportation (Non-Medical): Not on file  Physical Activity:   . Days of Exercise per Week: Not on file  . Minutes of Exercise per Session: Not on file  Stress:   . Feeling of Stress : Not on file  Social Connections:   . Frequency of Communication with Friends and Family: Not on file  . Frequency of Social Gatherings with Friends and Family: Not on file  . Attends Religious Services: Not on file  . Active Member of Clubs or Organizations: Not on file  . Attends Archivist Meetings: Not on file  .  Marital Status: Not on file  Intimate Partner Violence:   . Fear of Current or Ex-Partner: Not on file  . Emotionally Abused: Not on file  . Physically Abused: Not on file  . Sexually Abused: Not on file    Outpatient Medications Prior to Visit  Medication Sig Dispense Refill  . triamcinolone cream (KENALOG) 0.1 % Apply 1 application topically 2 (two) times daily. x7-10 days 30 g 0   No facility-administered medications prior to visit.    No Known Allergies  Review of Systems  Constitutional: Negative for fever.  Gastrointestinal: Positive for abdominal pain.  Genitourinary: Negative for dysuria and hematuria.  All other systems reviewed and are negative.      Objective:    Physical Exam Vitals reviewed. Exam conducted with a chaperone present.  Constitutional:      Appearance: She is well-developed and normal weight.  HENT:     Head: Normocephalic.    Eyes:     Conjunctiva/sclera: Conjunctivae normal.  Cardiovascular:     Rate and Rhythm: Normal rate and regular rhythm.  Pulmonary:     Effort: Pulmonary effort is normal.     Breath sounds: Normal breath sounds.  Abdominal:     General: Abdomen is flat. Bowel sounds are normal. There is no distension. There are signs of injury.     Tenderness: There is abdominal tenderness in the right lower quadrant. There is right CVA tenderness. There is no rebound.  Skin:    General: Skin is warm.  Neurological:     Mental Status: She is alert and oriented to person, place, and time.  Psychiatric:        Mood and Affect: Mood normal.        Behavior: Behavior normal.     BP 128/88   Pulse 81   Temp (!) 97.5 F (36.4 C) (Temporal)   Ht 5\' 3"  (1.6 m)   Wt 145 lb 6.4 oz (66 kg)   SpO2 98%   BMI 25.76 kg/m  Wt Readings from Last 3 Encounters:  02/13/20 145 lb 6.4 oz (66 kg)  02/12/20 145 lb 15.1 oz (66.2 kg)  07/17/19 146 lb (66.2 kg)    Health Maintenance Due  Topic Date Due  . COVID-19 Vaccine (1) Never done  . TETANUS/TDAP  Never done  . COLONOSCOPY  Never done  . INFLUENZA VACCINE  Never done    There are no preventive care reminders to display for this patient.   Lab Results  Component Value Date   TSH 1.320 06/23/2019   Lab Results  Component Value Date   WBC 6.1 06/23/2019   HGB 13.4 06/23/2019   HCT 40.7 06/23/2019   MCV 85 06/23/2019   PLT 284 06/23/2019   Lab Results  Component Value Date   NA 143 07/17/2019   K 4.4 07/17/2019   CO2 23 07/17/2019   GLUCOSE 90 07/17/2019   BUN 12 07/17/2019   CREATININE 0.89 07/17/2019   BILITOT 0.3 06/23/2019   ALKPHOS 111 06/23/2019   AST 22 06/23/2019   ALT 18 06/23/2019   PROT 7.4 06/23/2019   ALBUMIN 4.8 06/23/2019   CALCIUM 10.6 (H) 07/17/2019   Lab Results  Component Value Date   CHOL 189 06/23/2019   Lab Results  Component Value Date   HDL 60 06/23/2019   Lab Results  Component Value Date    LDLCALC 105 (H) 06/23/2019   Lab Results  Component Value Date   TRIG 139 06/23/2019  Lab Results  Component Value Date   CHOLHDL 3.2 06/23/2019   No results found for: HGBA1C     Assessment & Plan:   Problem List Items Addressed This Visit      Other   Right lower quadrant abdominal pain - Primary    Pain not well managed.  KUB ordered, CT ordered, Tylenol as needed for pain.  Zofran for nausea. Results pending Medication sent to pharmacy. Follow-up with worsening or unresolved symptoms.      Relevant Medications   ondansetron (ZOFRAN) 4 MG tablet   Other Relevant Orders   DG Abd 1 View   DG Abd 2 Views   Urine Microscopic   CBC with Differential   CT Abdomen Pelvis Wo Contrast   Nausea    Zofran 4 mg tablet every 8 hours as needed. Education provided with printed handouts given. Rx sent to pharmacy          Meds ordered this encounter  Medications  . ondansetron (ZOFRAN) 4 MG tablet    Sig: Take 1 tablet (4 mg total) by mouth every 8 (eight) hours as needed for nausea or vomiting.    Dispense:  20 tablet    Refill:  0    Order Specific Question:   Supervising Provider    Answer:   Caryl Pina A [[id number]]     Ivy Lynn, NP

## 2020-02-13 NOTE — Assessment & Plan Note (Signed)
Zofran 4 mg tablet every 8 hours as needed. Education provided with printed handouts given. Rx sent to pharmacy

## 2020-02-14 ENCOUNTER — Telehealth: Payer: Self-pay

## 2020-02-14 LAB — CBC WITH DIFFERENTIAL/PLATELET
Basophils Absolute: 0 10*3/uL (ref 0.0–0.2)
Basos: 1 %
EOS (ABSOLUTE): 0.2 10*3/uL (ref 0.0–0.4)
Eos: 3 %
Hematocrit: 42 % (ref 34.0–46.6)
Hemoglobin: 13.9 g/dL (ref 11.1–15.9)
Immature Grans (Abs): 0.1 10*3/uL (ref 0.0–0.1)
Immature Granulocytes: 1 %
Lymphocytes Absolute: 1.9 10*3/uL (ref 0.7–3.1)
Lymphs: 28 %
MCH: 28.2 pg (ref 26.6–33.0)
MCHC: 33.1 g/dL (ref 31.5–35.7)
MCV: 85 fL (ref 79–97)
Monocytes Absolute: 0.5 10*3/uL (ref 0.1–0.9)
Monocytes: 8 %
Neutrophils Absolute: 4 10*3/uL (ref 1.4–7.0)
Neutrophils: 59 %
Platelets: 292 10*3/uL (ref 150–450)
RBC: 4.93 x10E6/uL (ref 3.77–5.28)
RDW: 12.8 % (ref 11.7–15.4)
WBC: 6.8 10*3/uL (ref 3.4–10.8)

## 2020-02-14 NOTE — Telephone Encounter (Signed)
Daughter aware of results. Asking to see where she is at on process of CT scan. Please advise.

## 2020-02-15 ENCOUNTER — Telehealth: Payer: Self-pay

## 2020-02-16 ENCOUNTER — Ambulatory Visit (HOSPITAL_COMMUNITY): Payer: BC Managed Care – PPO

## 2020-02-21 DIAGNOSIS — R1031 Right lower quadrant pain: Secondary | ICD-10-CM | POA: Diagnosis not present

## 2020-09-06 ENCOUNTER — Ambulatory Visit: Payer: BC Managed Care – PPO | Admitting: Family Medicine

## 2020-09-06 ENCOUNTER — Encounter: Payer: Self-pay | Admitting: Family Medicine

## 2020-09-06 ENCOUNTER — Other Ambulatory Visit: Payer: Self-pay

## 2020-09-06 VITALS — BP 123/84 | HR 82 | Temp 98.0°F | Ht 63.0 in | Wt 145.6 lb

## 2020-09-06 DIAGNOSIS — R1030 Lower abdominal pain, unspecified: Secondary | ICD-10-CM | POA: Diagnosis not present

## 2020-09-06 DIAGNOSIS — R000 Tachycardia, unspecified: Secondary | ICD-10-CM

## 2020-09-06 DIAGNOSIS — R6889 Other general symptoms and signs: Secondary | ICD-10-CM | POA: Diagnosis not present

## 2020-09-06 DIAGNOSIS — R030 Elevated blood-pressure reading, without diagnosis of hypertension: Secondary | ICD-10-CM

## 2020-09-06 DIAGNOSIS — R42 Dizziness and giddiness: Secondary | ICD-10-CM

## 2020-09-06 DIAGNOSIS — Z1211 Encounter for screening for malignant neoplasm of colon: Secondary | ICD-10-CM

## 2020-09-06 LAB — URINALYSIS, ROUTINE W REFLEX MICROSCOPIC
Bilirubin, UA: NEGATIVE
Glucose, UA: NEGATIVE
Ketones, UA: NEGATIVE
Leukocytes,UA: NEGATIVE
Nitrite, UA: NEGATIVE
Protein,UA: NEGATIVE
RBC, UA: NEGATIVE
Specific Gravity, UA: 1.01 (ref 1.005–1.030)
Urobilinogen, Ur: 0.2 mg/dL (ref 0.2–1.0)
pH, UA: 5.5 (ref 5.0–7.5)

## 2020-09-06 MED ORDER — MECLIZINE HCL 25 MG PO TABS: 25.0000 mg | ORAL_TABLET | Freq: Three times a day (TID) | ORAL | 2 refills | Status: DC | PRN

## 2020-09-06 NOTE — Patient Instructions (Addendum)
Mareos Dizziness Los mareos son un problema muy frecuente. Causan sensacin de inestabilidad o de desvanecimiento. Puede sentir que se va a desmayar. Los [employer] pueden provocarle una lesin si se tropieza o se cae. La causa puede deberse a [employer], tales como los siguientes:  Medicamentos.  No tener suficiente agua en el cuerpo (deshidratacin).  Enfermedad. Siga estas indicaciones en su casa: Comida y bebida  Beba suficiente lquido para mantener el pis (orina) claro o de color amarillo plido. Esto evita la deshidratacin. Trate de beber ms lquidos transparentes, como agua.  No beba alcohol.  Limite la cantidad de cafena que bebe o come si el mdico se lo indica.  Limite la cantidad de sal (sodio) que bebe o come si el mdico se lo indica.   Actividad  Evite los movimientos rpidos. ? Cuando se levante de una silla, sujtese hasta sentirse bien. ? Por la maana, sintese primero a un lado de la cama. Cuando se sienta bien, pngase lentamente de pie mientras se sostiene de algo. Haga esto hasta que se sienta seguro en cuanto al equilibrio.  Mueva las piernas con frecuencia si debe estar de pie en un lugar durante mucho tiempo. Mientras est de pie, contraiga y relaje los msculos de las piernas.  No conduzca vehculos ni opere maquinaria pesada si se siente mareado.  Evite agacharse si se siente mareado. En su casa, coloque los objetos en algn lugar que le resulte fcil alcanzarlos sin agacharse.   Estilo de vida  No consuma ningn producto que contenga nicotina o tabaco, como cigarrillos y Psychologist, sport and exercise. Si necesita ayuda para dejar de fumar, consulte al mdico.  Intente bajar el nivel de estrs. Para hacerlo, puede usar mtodos como el yoga o la meditacin. Hable con el mdico si necesita ayuda. Instrucciones generales  Controle sus mareos para ver si hay cambios.  Tome los medicamentos de venta libre y los recetados solamente como se lo haya indicado  el mdico. Hable con el mdico si cree que la causa de sus mareos es algn medicamento que est tomando.  Infrmele a un amigo o a un familiar si se siente mareado. Pdale a esta persona que llame al mdico si observa cambios en su comportamiento.  Concurra a todas las visitas de control como se lo haya indicado el mdico. Esto es importante. Comunquese con un mdico si:  Los [employer].  Los [employer] o la sensacin de Engineer, petroleum.  Siente malestar estomacal (nuseas).  Tiene problemas para escuchar.  Aparecen nuevos sntomas.  Siente inestabilidad al estar de pie.  Siente que la Development worker, international aid vueltas. Solicite ayuda de inmediato si:  Vomita o tiene heces acuosas (diarrea), y no puede comer o beber nada.  Tiene dificultad para hacer lo siguiente: ? Hablar. ? Caminar. ? Tragar. ? Usar los brazos, las [place] piernas.  Se siente constantemente dbil.  No piensa con claridad o tiene dificultad para armar oraciones. Es posible que un amigo o un familiar adviertan que esto ocurre.  Tiene los siguientes sntomas: ? Tourist information centre manager. ? Dolor en el vientre (abdomen). ? Falta de aire. ? Sudoracin.  Cambios en la visin.  Sangrado.  Dolor de cabeza muy intenso.  Dolor o rigidez en el cuello.  Cristy Hilts. Estos sntomas pueden Sales executive. No espere hasta que los sntomas desaparezcan. Solicite atencin mdica de inmediato. Comunquese con el servicio de emergencias de su localidad (911 en los [place]). No conduzca por sus propios medios [employer]. Resumen  Los [employer] causan sensacin de inestabilidad o de desvanecimiento. Puede sentir que se va a desmayar.  Beba suficiente lquido para mantener el pis (orina) claro o de color amarillo plido. No beba alcohol.  Evite los movimientos rpidos si se siente mareado.  Controle sus mareos para ver si hay cambios. Esta informacin no tiene Marine scientist el consejo del  mdico. Asegrese de hacerle al mdico cualquier pregunta que tenga. Document Revised: 10/01/2016 Document Reviewed: 10/01/2016 Elsevier Patient Education  [place].

## 2020-09-06 NOTE — Progress Notes (Signed)
Assessment & Plan:  1. Dizziness Education provided on dizziness.  - Anemia Profile B - CMP14+EGFR - TSH - EKG 12-Lead - meclizine (ANTIVERT) 25 MG tablet; Take 1 tablet (25 mg total) by mouth 3 (three) times daily as needed for dizziness.  Dispense: 60 tablet; Refill: 2  2. Lower abdominal pain - Urinalysis, Routine w reflex microscopic - Ambulatory referral to Gastroenterology - US Abdomen Limited; Future  3. Tachycardia - Ambulatory referral to Cardiology  4. Elevated BP without diagnosis of hypertension Encouraged to monitor and keep eating low salt.  5. Colon cancer screening - Ambulatory referral to Gastroenterology   Follow up plan: Return in about 4 weeks (around 10/04/2020) for follow-up of chronic medication conditions.  Hendricks Limes, MSN, APRN, FNP-C [facility]  Subjective:   Patient ID: Alyssa Walters, female    DOB: September 18, 1955, 65 y.o.   MRN: [id number]  HPI: Alyssa Walters is a 65 y.o. female presenting on 09/06/2020 for Dizziness, Abdominal Pain, and Heart Problem  Patient is accompanied by her daughter who she is okay with being present.  She is Spanish-speaking and does not desire a Optometrist, but wants her daughter to be her Optometrist.  Patient reports dizziness that comes and goes.  She initially said it has been going on for 2 weeks, but then says it has actually been going on for 6+ months.  It typically only last a few minutes at a time but is starting to last longer.  It occurs with position changes and abdominal pain.  She denies any shortness of breath.  She does report episodes of tachycardia, but states she is not dizzy when she experiences the tachycardia.  She reports her abdominal pain is in the right lower quadrant.  She had a CT scan done in November that did not show a cause for her pain.  Reports normal bowel movements every time she eats.  Denies any diarrhea.  No blood or mucus in her stool.  She does have a history of  kidney stones but states this feels nothing like that.  She was previously referred to gastroenterology for a colonoscopy, but was unable to schedule an appointment at that time.  Patient is very concerned about her heart due to her episodes of tachycardia.  She would like a full cardiac work-up.  Appointment was initially made due to an elevated blood pressure reading of 145/91 four days ago.  She cut back on the amount of salt she was eating and has had normal blood pressure since.   ROS: Negative unless specifically indicated above in HPI.   Relevant past medical history reviewed and updated as indicated.   Allergies and medications reviewed and updated.  No current outpatient medications on file.  No Known Allergies  Objective:   BP 123/84   Pulse 82   Temp 98 F (36.7 C) (Temporal)   Ht 5' 3"  (1.6 m)   Wt 145 lb 9.6 oz (66 kg)   SpO2 95%   BMI 25.79 kg/m    Physical Exam Vitals reviewed.  Constitutional:      General: She is not in acute distress.    Appearance: Normal appearance. She is not ill-appearing, toxic-appearing or diaphoretic.  HENT:     Head: Normocephalic and atraumatic.  Eyes:     General: No scleral icterus.       Right eye: No discharge.        Left eye: No discharge.     Conjunctiva/sclera: Conjunctivae normal.  Neck:     Vascular: No carotid bruit.  Cardiovascular:     Rate and Rhythm: Normal rate.  Pulmonary:     Effort: Pulmonary effort is normal. No respiratory distress.  Abdominal:     General: Abdomen is flat. Bowel sounds are normal. There is no distension or abdominal bruit. There are no signs of injury.     Palpations: Abdomen is soft. There is no shifting dullness, fluid wave, hepatomegaly, splenomegaly, mass or pulsatile mass.     Tenderness: There is abdominal tenderness in the right lower quadrant and left lower quadrant.  Musculoskeletal:        General: Normal range of motion.     Cervical back: Normal range of motion.   Skin:    General: Skin is warm and dry.     Capillary Refill: Capillary refill takes less than 2 seconds.  Neurological:     General: No focal deficit present.     Mental Status: She is alert and oriented to person, place, and time. Mental status is at baseline.  Psychiatric:        Mood and Affect: Mood normal.        Behavior: Behavior normal.        Thought Content: Thought content normal.        Judgment: Judgment normal.    EKG: sinus rhythm

## 2020-09-07 LAB — ANEMIA PROFILE B
Basophils Absolute: 0.1 10*3/uL (ref 0.0–0.2)
Basos: 1 %
EOS (ABSOLUTE): 0.1 10*3/uL (ref 0.0–0.4)
Eos: 2 %
Ferritin: 53 ng/mL (ref 15–150)
Folate: 12.4 ng/mL (ref >3.0)
Hematocrit: 41.2 % (ref 34.0–46.6)
Hemoglobin: 14 g/dL (ref 11.1–15.9)
Immature Grans (Abs): 0 10*3/uL (ref 0.0–0.1)
Immature Granulocytes: 0 %
Iron Saturation: 16 % (ref 15–55)
Iron: 47 ug/dL (ref 27–139)
Lymphocytes Absolute: 2.2 10*3/uL (ref 0.7–3.1)
Lymphs: 30 %
MCH: 28.3 pg (ref 26.6–33.0)
MCHC: 34 g/dL (ref 31.5–35.7)
MCV: 83 fL (ref 79–97)
Monocytes Absolute: 0.6 10*3/uL (ref 0.1–0.9)
Monocytes: 8 %
Neutrophils Absolute: 4.2 10*3/uL (ref 1.4–7.0)
Neutrophils: 59 %
Platelets: 278 10*3/uL (ref 150–450)
RBC: 4.94 x10E6/uL (ref 3.77–5.28)
RDW: 12.3 % (ref 11.7–15.4)
Retic Ct Pct: 1.4 % (ref 0.6–2.6)
Total Iron Binding Capacity: 289 ug/dL (ref 250–450)
UIBC: 242 ug/dL (ref 118–369)
Vitamin B-12: 510 pg/mL (ref 232–1245)
WBC: 7.2 10*3/uL (ref 3.4–10.8)

## 2020-09-07 LAB — CMP14+EGFR
ALT: 17 IU/L (ref 0–32)
AST: 16 IU/L (ref 0–40)
Albumin/Globulin Ratio: 2 (ref 1.2–2.2)
Albumin: 4.7 g/dL (ref 3.8–4.8)
Alkaline Phosphatase: 106 IU/L (ref 44–121)
BUN/Creatinine Ratio: 27 (ref 12–28)
BUN: 22 mg/dL (ref 8–27)
Bilirubin Total: 0.4 mg/dL (ref 0.0–1.2)
CO2: 23 mmol/L (ref 20–29)
Calcium: 10.4 mg/dL — ABNORMAL HIGH (ref 8.7–10.3)
Chloride: 100 mmol/L (ref 96–106)
Creatinine, Ser: 0.82 mg/dL (ref 0.57–1.00)
Globulin, Total: 2.3 g/dL (ref 1.5–4.5)
Glucose: 93 mg/dL (ref 65–99)
Potassium: 4 mmol/L (ref 3.5–5.2)
Sodium: 139 mmol/L (ref 134–144)
Total Protein: 7 g/dL (ref 6.0–8.5)
eGFR: 80 mL/min/{1.73_m2} (ref >59)

## 2020-09-07 LAB — TSH: TSH: 1.79 u[IU]/mL (ref 0.450–4.500)

## 2020-09-08 LAB — LIPID PANEL
Chol/HDL Ratio: 3.8 ratio (ref 0.0–4.4)
Cholesterol, Total: 204 mg/dL — ABNORMAL HIGH (ref 100–199)
HDL: 54 mg/dL (ref >39)
LDL Chol Calc (NIH): 120 mg/dL — ABNORMAL HIGH (ref 0–99)
Triglycerides: 172 mg/dL — ABNORMAL HIGH (ref 0–149)
VLDL Cholesterol Cal: 30 mg/dL (ref 5–40)

## 2020-09-08 LAB — SPECIMEN STATUS REPORT

## 2020-09-10 ENCOUNTER — Telehealth: Payer: Self-pay | Admitting: Family Medicine

## 2020-09-17 NOTE — Telephone Encounter (Signed)
Lmtcb No call back  This encounter will be closed  

## 2020-09-18 ENCOUNTER — Ambulatory Visit (HOSPITAL_COMMUNITY): Admission: RE | Admit: 2020-09-18 | Payer: BC Managed Care – PPO | Source: Ambulatory Visit

## 2020-09-20 ENCOUNTER — Encounter: Payer: Self-pay | Admitting: *Deleted

## 2020-09-25 ENCOUNTER — Encounter: Payer: Self-pay | Admitting: Family Medicine

## 2020-10-04 ENCOUNTER — Encounter: Payer: Self-pay | Admitting: Family Medicine

## 2020-10-04 ENCOUNTER — Ambulatory Visit: Payer: BC Managed Care – PPO | Admitting: Family Medicine

## 2020-10-04 ENCOUNTER — Other Ambulatory Visit: Payer: Self-pay

## 2020-10-04 VITALS — BP 139/92 | HR 115 | Temp 97.6°F | Ht 63.0 in | Wt 143.2 lb

## 2020-10-04 DIAGNOSIS — Z789 Other specified health status: Secondary | ICD-10-CM

## 2020-10-04 DIAGNOSIS — Z758 Other problems related to medical facilities and other health care: Secondary | ICD-10-CM

## 2020-10-04 DIAGNOSIS — Z9189 Other specified personal risk factors, not elsewhere classified: Secondary | ICD-10-CM

## 2020-10-05 NOTE — Progress Notes (Signed)
[employer] video interpreter ID [id number] used for Spanish translation of this visit.  Her daughter, Alyssa Walters, was also present for today's visit via telephone.  Patient here to discuss recent lab work.  The better part of our appointment was spent discussing her dissatisfaction with lack of follow up after her last 2 appointments at our facility.  I have reached out to the involved providers.    We have taken appropriate measures to fill out a disability particularly so that her daughter, who does speak English, to access her patient record and follow-up on labs going forward.  The patient did not wish to discontinue her MyChart access but did asked that we call her with labs and or mail them to her home as well.  Apparently, her son who is a physician in her home country, likes to review these labs.  We have also scheduled her a follow-up visit for full physical exam with fasting labs in August with me.  We discussed maneuvering the healthcare system in [place] and we will utilize our Spanish-speaking employees in the office to help navigate this along with the patient.  I encouraged her to make sure that she has an appointment scheduled after each visit, specifically with me if she desires to see me intervally.  She was great appreciation for my time and will follow up with me in August.

## 2020-10-19 IMAGING — MG DIGITAL SCREENING BILAT W/ TOMO W/ CAD
8 series · 9 of 24 positions shown · non-contrast
Comparison: None.

CLINICAL DATA: Screening.

EXAM:
DIGITAL SCREENING BILATERAL MAMMOGRAM WITH TOMO AND CAD

[L MLO synth-2D]
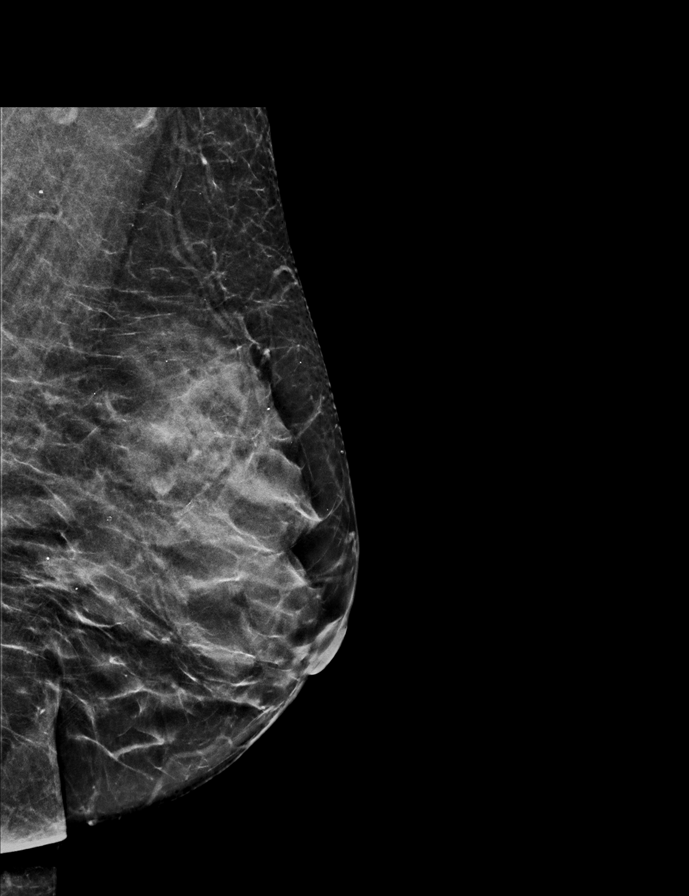

[L CC synth-2D]
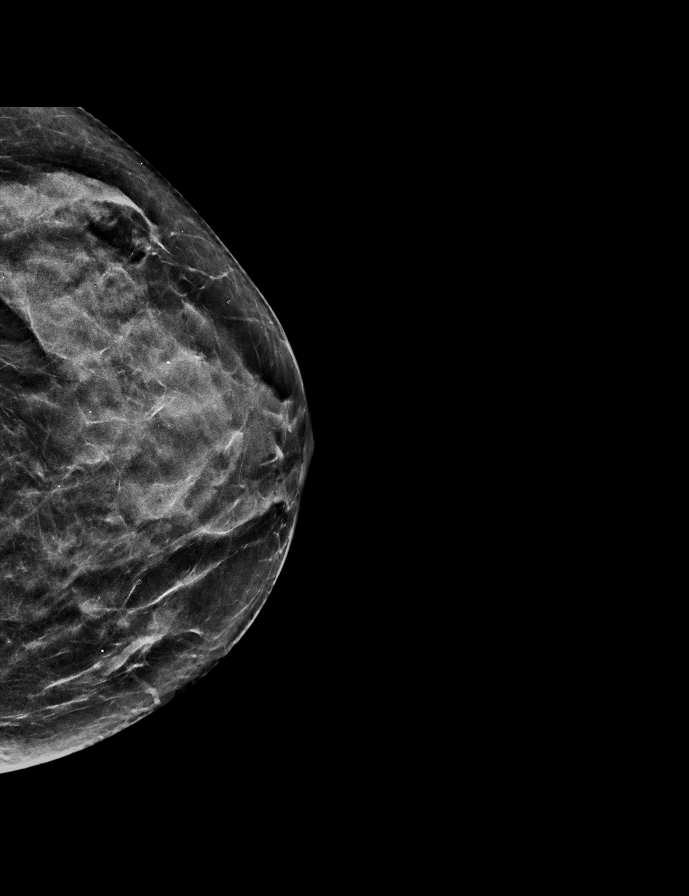

[R MLO synth-2D]
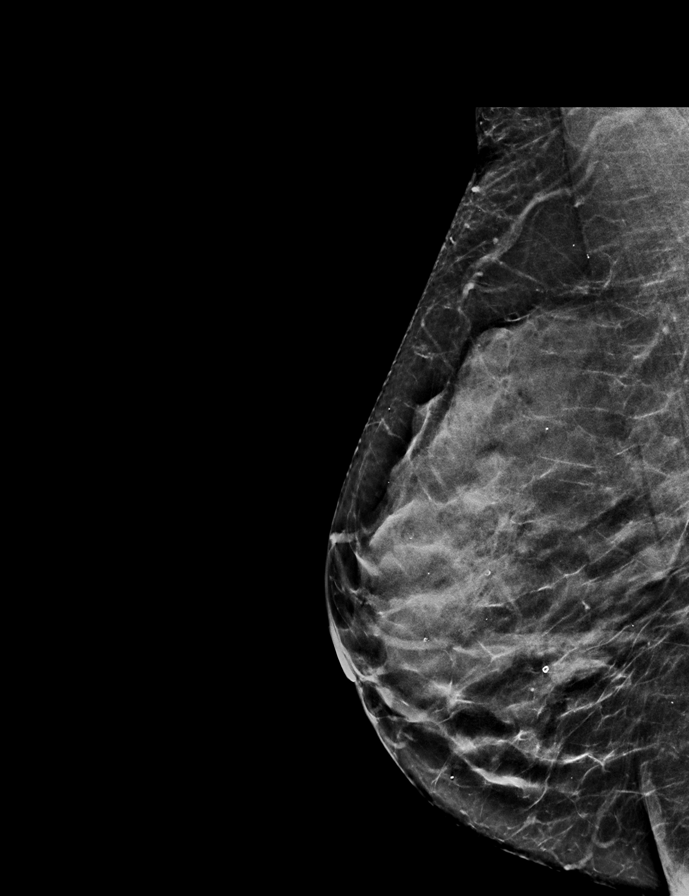

[R CC synth-2D]
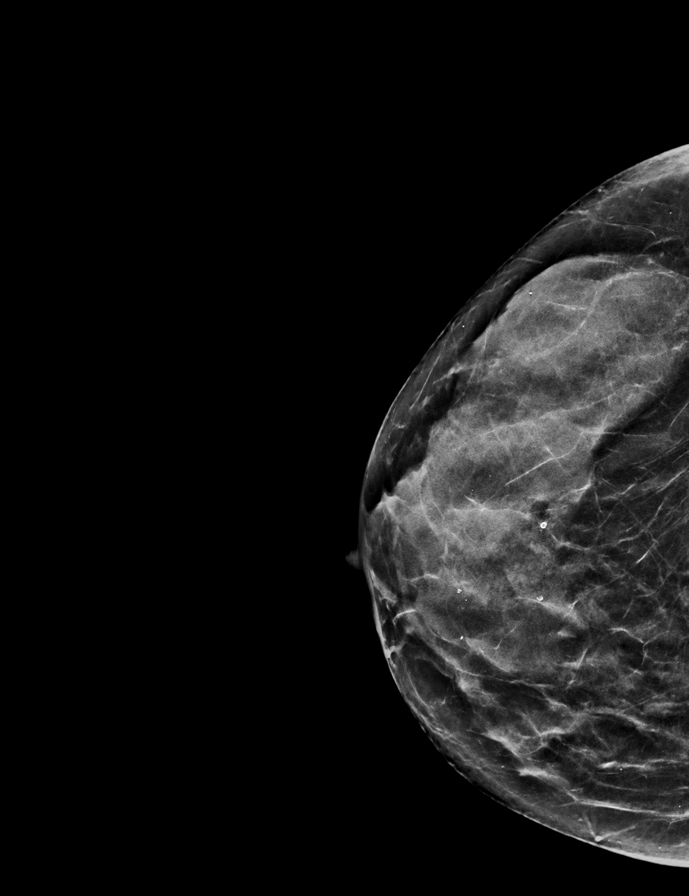

[L MLO tomo · 2 of 57 frames shown]
[frame 19/57]
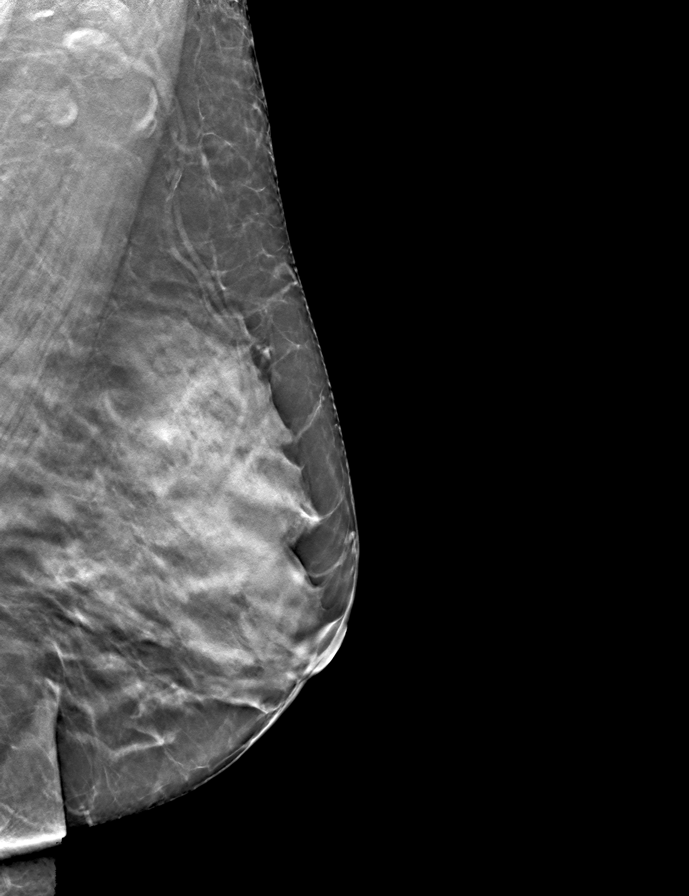
[frame 29/57]
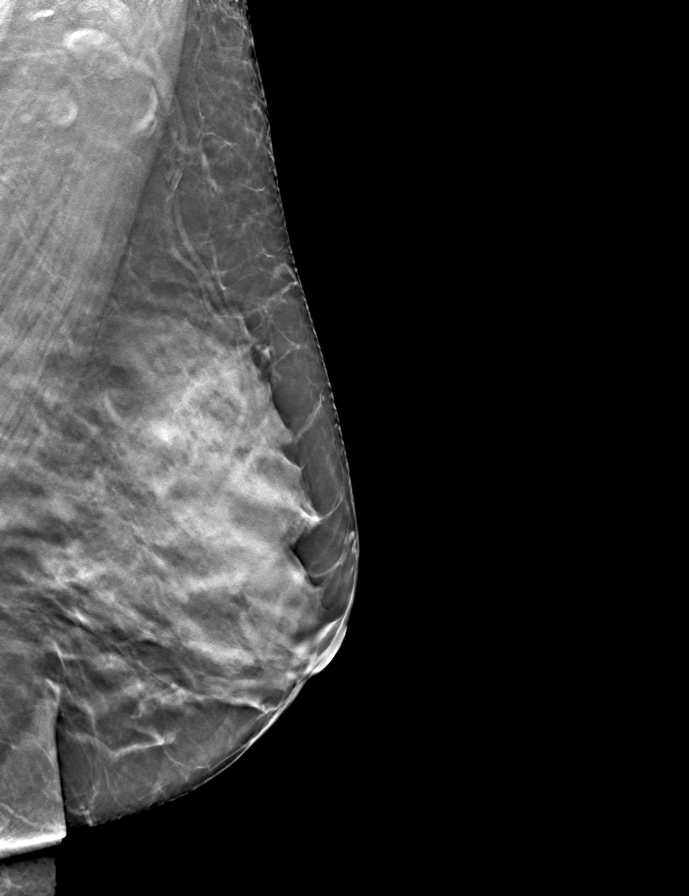

[L CC tomo · tomo slice 27/52.0]
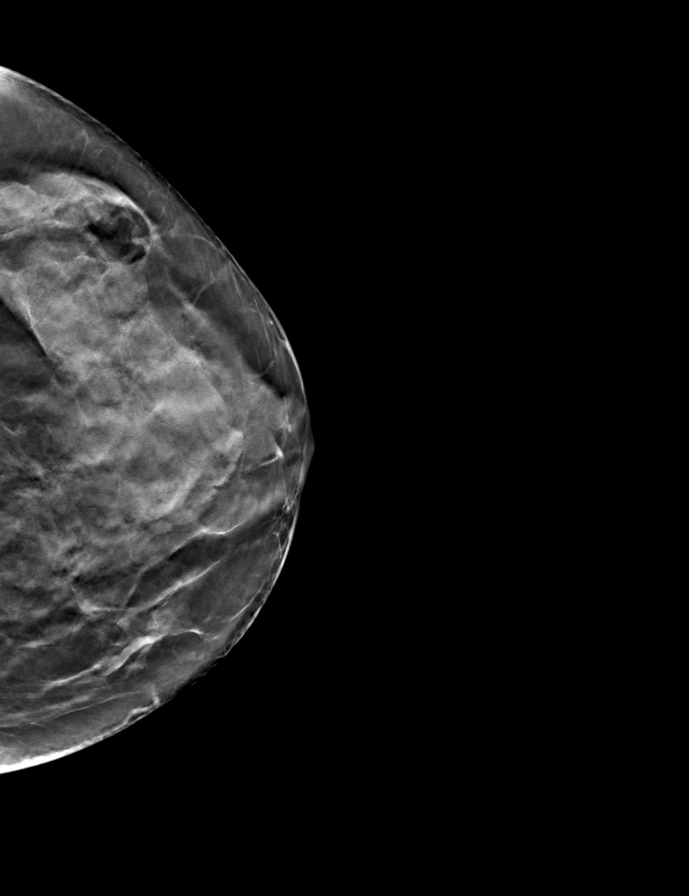

[R CC tomo · tomo slice 26/51.0]
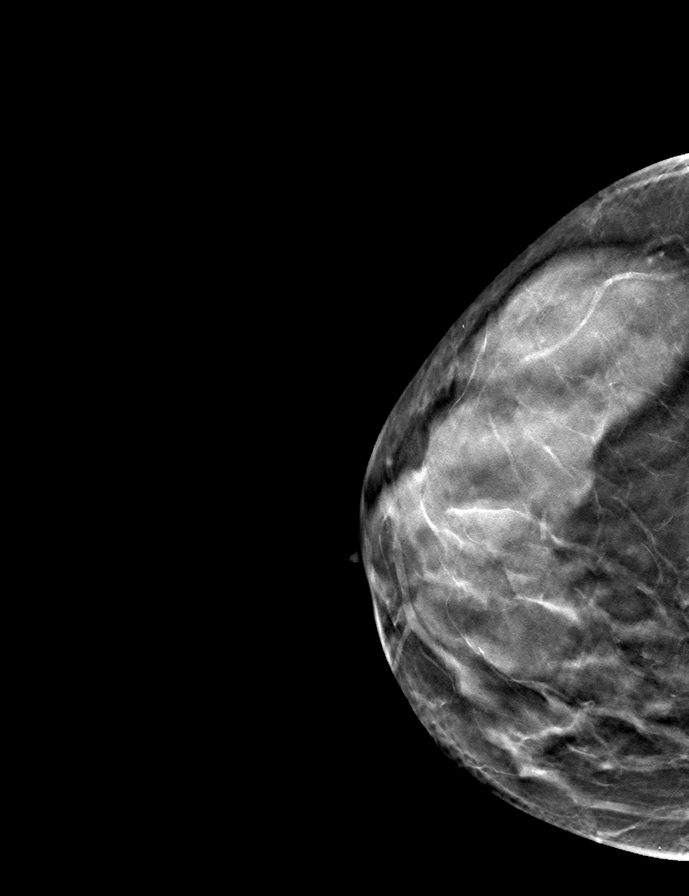

[R MLO tomo · tomo slice 28/55.0]
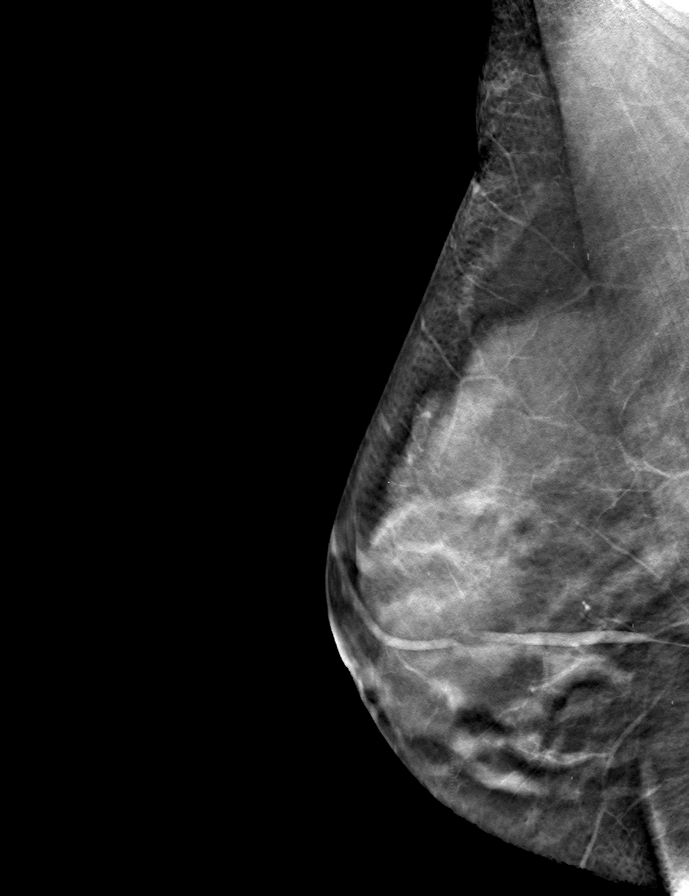

[9 of 24 positions shown; findings below may reference images not displayed]

ACR Breast Density Category d: The breast tissue is extremely dense,
which lowers the sensitivity of mammography.
FINDINGS: There are no findings suspicious for malignancy. Images were
processed with CAD.
IMPRESSION: No mammographic evidence of malignancy. A result letter of this
screening mammogram will be mailed directly to the patient.

RECOMMENDATION:
Screening mammogram in one year. (Code:F8-6-TBV)

BI-RADS CATEGORY  1: Negative.

## 2020-11-18 ENCOUNTER — Other Ambulatory Visit: Payer: Self-pay

## 2020-11-18 ENCOUNTER — Other Ambulatory Visit: Payer: BC Managed Care – PPO

## 2020-11-18 DIAGNOSIS — R030 Elevated blood-pressure reading, without diagnosis of hypertension: Secondary | ICD-10-CM | POA: Diagnosis not present

## 2020-11-18 LAB — CMP14+EGFR
ALT: 13 IU/L (ref 0–32)
AST: 16 IU/L (ref 0–40)
Albumin/Globulin Ratio: 2.1 (ref 1.2–2.2)
Albumin: 4.6 g/dL (ref 3.8–4.8)
Alkaline Phosphatase: 104 IU/L (ref 44–121)
BUN/Creatinine Ratio: 20 (ref 12–28)
BUN: 15 mg/dL (ref 8–27)
Bilirubin Total: 0.4 mg/dL (ref 0.0–1.2)
CO2: 25 mmol/L (ref 20–29)
Calcium: 10.3 mg/dL (ref 8.7–10.3)
Chloride: 106 mmol/L (ref 96–106)
Creatinine, Ser: 0.75 mg/dL (ref 0.57–1.00)
Globulin, Total: 2.2 g/dL (ref 1.5–4.5)
Glucose: 97 mg/dL (ref 65–99)
Potassium: 4.6 mmol/L (ref 3.5–5.2)
Sodium: 142 mmol/L (ref 134–144)
Total Protein: 6.8 g/dL (ref 6.0–8.5)
eGFR: 89 mL/min/{1.73_m2} (ref >59)

## 2020-11-18 LAB — LIPID PANEL
Chol/HDL Ratio: 3.9 ratio (ref 0.0–4.4)
Cholesterol, Total: 201 mg/dL — ABNORMAL HIGH (ref 100–199)
HDL: 52 mg/dL (ref >39)
LDL Chol Calc (NIH): 129 mg/dL — ABNORMAL HIGH (ref 0–99)
Triglycerides: 111 mg/dL (ref 0–149)
VLDL Cholesterol Cal: 20 mg/dL (ref 5–40)

## 2020-11-19 ENCOUNTER — Encounter: Payer: Self-pay | Admitting: Family Medicine

## 2020-11-19 ENCOUNTER — Ambulatory Visit (INDEPENDENT_AMBULATORY_CARE_PROVIDER_SITE_OTHER): Payer: BC Managed Care – PPO | Admitting: Family Medicine

## 2020-11-19 VITALS — BP 122/76 | HR 83 | Temp 97.4°F | Ht 63.0 in | Wt 140.8 lb

## 2020-11-19 DIAGNOSIS — Z0000 Encounter for general adult medical examination without abnormal findings: Secondary | ICD-10-CM | POA: Diagnosis not present

## 2020-11-19 DIAGNOSIS — H60331 Swimmer's ear, right ear: Secondary | ICD-10-CM | POA: Diagnosis not present

## 2020-11-19 DIAGNOSIS — E7800 Pure hypercholesterolemia, unspecified: Secondary | ICD-10-CM

## 2020-11-19 DIAGNOSIS — N898 Other specified noninflammatory disorders of vagina: Secondary | ICD-10-CM | POA: Diagnosis not present

## 2020-11-19 DIAGNOSIS — Z789 Other specified health status: Secondary | ICD-10-CM

## 2020-11-19 LAB — WET PREP FOR TRICH, YEAST, CLUE
Clue Cell Exam: NEGATIVE
Trichomonas Exam: NEGATIVE
Yeast Exam: NEGATIVE

## 2020-11-19 MED ORDER — NEOMYCIN-POLYMYXIN-HC 3.5-10000-1 OT SUSP: 3.0000 [drp] | Freq: Four times a day (QID) | OTIC | 0 refills | Status: AC

## 2020-11-19 NOTE — Patient Instructions (Addendum)
Su colesterol permanece elevado. Contine trabajando en la dieta y el ejercicio. No se necesita medicacin todava. Volveremos a revisar en 6 meses. Se colocan los laboratorios y puede entrar cuando le [place] no coma ni beba nada durante las 8 horas anteriores a la prueba de Designer, fashion/clothing.  Por favor presentarse a sus citas al menos 10 minutos antes de su cita programada. Si no puede llegar a Engineer, building services futuro, Engineer, maintenance su cita. Tenga en cuenta que esta poltica se implement para garantizar que tenga el tiempo y la atencin adecuados para sus necesidades de atencin mdica y que se respete el tiempo de otros pacientes que estn programados despus de su cita. Si no puede llegar a tiempo, el personal de recepcin estar encantado de ofrecerle la prxima cita disponible.  Disfuncin de la trompa de [place]ustaquio Eustachian Tube Dysfunction  Disfuncin de la trompa de Eustaquio hace referencia a una afeccin en la que se forma una obstruccin en el pasaje estrecho que conecta el odo medio con la parte posterior de la nariz (trompa de [place]). La trompa de [employer] regula la presin del aire en el odo medio al permitir que el aire se mueva entre el odo y la Doran Durand. Tambin ayuda a drenarel lquido del espacio del odo medio. La disfuncin de la trompa de [place]ustaquio puede afectar a uno o ambos odos. Cuando la trompa de Eustaquio no funciona correctamente, la presin de aire, delquido, o ambas pueden acumularse en el odo medio. Cules son las causas? Esta afeccin se produce cuando la trompa de Eustaquio se obstruye o no puede abrirse normalmente. Las causas ms frecuentes de esta afeccin incluyen las siguientes: Infecciones en los odos. Resfriados y otras infecciones que afectan la nariz, la boca y la garganta (vas respiratorias superiores). Alergias. Irritacin por el humo del cigarrillo. Irritacin por el retroceso de cido estomacal hacia el esfago (reflujo gastroesofgico).  El esfago es un conducto que transporta los alimentos y bebidas desde la boca al [place]. Cambios sbitos en la presin del aire, como cuando baja el avin o cuando se practica buceo. Crecimientos anormales en la nariz o la garganta, por ejemplo: Crecimientos en el interior de la nariz (plipos nasales). Crecimiento anormal de clulas (tumores). Agrandamiento de tejido en la parte posterior de la garganta (adenoides). Qu incrementa el riesgo? Es ms probable que contraiga esta afeccin si: Fuma. Tiene sobrepeso. Es un nio que tiene: Ciertos defectos congnitos de la boca, como fisura Field seismologist. Amgdalas o adenoides grandes. Cules son los signos o sntomas? Los sntomas frecuentes de esta afeccin incluyen los siguientes: Sensacin de que el odo est lleno. Dolor de odo. Ruidos de chasquidos o estallidos en el odo. Zumbidos en el odo. Prdida auditiva. Prdida del equilibrio. Mareos. Los sntomas pueden empeorar cuando la presin del aire que lo rodea cambia,como cuando viaja a una zona de gran elevacin, vuela en avin o practica buceo. Cmo se diagnostica? Esta afeccin se puede diagnosticar en funcin de lo siguiente: Sus sntomas. Un examen fsico de los odos, la nariz y Patent examiner. Estudios, como por ejemplo aquellos que miden: El movimiento del tmpano (timpanograma). Su audicin Lorel Monaco). Cmo se trata? El tratamiento depende de la causa y de la gravedad de Engineer, manufacturing systems. En los casos leves, es posible [employer] sntomas con el movimiento de aire [place] odos. Esto se denomina "destaparse los odos". En los casos ms graves, o si tiene sntomas de lquido [employer] odos, el tratamiento puede incluir: Medicamentos para Human resources officer congestin (descongestivos).  Medicamentos para tratar alergias (antihistamnicos). Aerosoles nasales o gotas ticas que contengan medicamentos que reduzcan la inflamacin (corticoesteroides). Un procedimiento para drenar el  lquido del tmpano (miringotoma). En este procedimiento, se coloca un tubo pequeo en el tmpano para: Drenar el lquido. Restablecer el aire en el espacio del odo medio. Un procedimiento para insertar un dispositivo de baln a travs de la nariz para inflar la abertura de la trompa de Eustaquio (dilatacin con baln). Siga estas indicaciones en su casa: Estilo de vida No haga ninguna de estas cosas hasta que el mdico lo autorice: Viajar a grandes alturas. Viajar en avin. Estrellita Ludwig en Ardelia Mems cabina o una habitacin presurizada. Practicar buceo. No consuma ningn producto que contenga nicotina o tabaco, como cigarrillos y Psychologist, sport and exercise. Si necesita ayuda para dejar de consumir, consulte al mdico. Mantenga los odos secos. Use tapones para los odos que le calcen bien al ducharse y baarse. Despus squese bien los odos. Indicaciones generales [employer] de venta libre y los recetados solamente como se lo haya indicado el mdico. Use las tcnicas que le haya recomendado el mdico para ayudar a destaparse los odos. Estas medidas pueden incluir: Psychologist, clinical. Bostezos. Tragar vigorosamente con frecuencia. Cerrar la boca, taparse la nariz y soplar suavemente como si estuviera tratando de soplar aire por la Doran Durand. Concurrir a todas las visitas de seguimiento como se lo haya indicado el mdico. Esto es importante. Comunquese con un mdico si: Sus sntomas no desaparecen despus del tratamiento. Los sntomas regresan despus del tratamiento. No logra destaparse los odos. Tiene los siguientes sntomas: Cristy Hilts. Dolor en el odo. Dolor en la cabeza o el cuello. Lquido que le supura del odo. La audicin le cambia de repente. Se siente muy mareado. Pierde el equilibrio. Resumen Disfuncin de la trompa de Eustaquio hace referencia a una afeccin en la que se forma una obstruccin en la trompa de [place]. Puede ser consecuencia de infecciones del odo, alergias,  inhalacin de sustancias irritantes o crecimientos anormales en la nariz o la garganta. Los sntomas incluyen dolor de odo, prdida de la audicin o zumbidos [employer] odos. Los casos leves se tratan con maniobras para destapar los odos, como bostezar o [employer] odos. Los [employer] graves se tratan con medicamentos. Tambin puede realizarse una ciruga (poco frecuente). Esta informacin no tiene Marine scientist el consejo del mdico. Asegresede hacerle al mdico cualquier pregunta que tenga. Document Revised: 08/25/2017 Document Reviewed: 08/25/2017 Elsevier Patient Education  [place].

## 2020-11-19 NOTE — Progress Notes (Signed)
Alyssa Walters is a 65 y.o. female presents to office today for annual physical exam examination.    [employer] video interpreter Alyssa Walters, [name] used for Spanish translation of this visit  Concerns today include: 1. Right ear irritation/ dizziness She reports that the dizziness has gotten a lot better but still is intermittently present.  After our last visit, she ended up developing some swelling in the right side of her face and sought care for this.  She was placed on amoxicillin and this seemed to resolve her symptoms.  No recurrent swelling of the face but does occasionally have discharge of the right eye.  2.  Vaginal irritation Patient reports some vaginal irritation but does not report any discharge.  She is postmenopausal.  No pelvic bleeding.  Occupation: Psychologist, educational, Marital status: single, Substance use: none Diet: low sugar, low fat, Exercise: stays active at work Last colonoscopy: Scheduled 12/31/20 Last mammogram: UTD Last pap smear: UTD 2021, normal Refills needed today: none Immunizations needed:  There is no immunization history on file for this patient.   Past Medical History:  Diagnosis Date   Gallbladder stone without cholecystitis or obstruction    Kidney stone    Migraines    Renal stone    Social History   Socioeconomic History   Marital status: Divorced    Spouse name: Not on file   Number of children: 5   Years of education: Not on file   Highest education level: Not on file  Occupational History   Not on file  Tobacco Use   Smoking status: Passive Smoke Exposure - Never Smoker   Smokeless tobacco: Never  Vaping Use   Vaping Use: Never used  Substance and Sexual Activity   Alcohol use: Yes    Alcohol/week: 1.0 standard drink    Types: 1 Glasses of wine per week   Drug use: Never   Sexual activity: Not Currently  Other Topics Concern   Not on file  Social History Narrative   Moved from [place].  Resides in [place].   Has 5 children, 1  lives in [place] and works as a Tax adviser.   1 daughter in [place]   Social Determinants of Health   Financial Resource Strain: Not on file  Food Insecurity: Not on file  Transportation Needs: Not on file  Physical Activity: Not on file  Stress: Not on file  Social Connections: Not on file  Intimate Partner Violence: Not on file   Past Surgical History:  Procedure Laterality Date   BREAST SURGERY Left    cyst drained   CESAREAN SECTION     CHOLECYSTECTOMY     LITHOTRIPSY     Family History  Problem Relation Age of Onset   Cancer Mother    Arthritis Mother    Kidney disease Father    Cancer Sister    Clotting disorder Brother     Current Outpatient Medications:    meclizine (ANTIVERT) 25 MG tablet, Take 1 tablet (25 mg total) by mouth 3 (three) times daily as needed for dizziness., Disp: 60 tablet, Rfl: 2  No Known Allergies   ROS: Review of Systems Pertinent items noted in HPI and remainder of comprehensive ROS otherwise negative.    Physical exam BP 122/76   Pulse 83   Temp (!) 97.4 F (36.3 C)   Ht '5\' 3"'$  (1.6 m)   Wt 140 lb 12.8 oz (63.9 kg)   SpO2 98%   BMI 24.94 kg/m  General appearance: alert,  cooperative, appears stated age, and no distress Head: Normocephalic, without obvious abnormality, atraumatic Eyes: negative findings: lids and lashes normal, conjunctivae and sclerae normal, corneas clear, and pupils equal, round, reactive to light and accomodation Ears:  Right TM obscured by quite a bit of cerumen and discharge in the right external auditory canal  left TM is normal in appearance. Nose: Nares normal. Septum midline. Mucosa normal. No drainage or sinus tenderness. Throat: lips, mucosa, and tongue normal; teeth and gums normal Neck: no adenopathy, supple, symmetrical, trachea midline, and thyroid not enlarged, symmetric, no tenderness/mass/nodules Back: symmetric, no curvature. ROM normal. No CVA tenderness. Lungs: clear to auscultation  bilaterally Breasts: normal appearance, no masses or tenderness, Inspection negative, No nipple retraction or dimpling, No nipple discharge or bleeding, No axillary or supraclavicular adenopathy Heart: regular rate and rhythm, S1, S2 normal, no murmur, click, rub or gallop Abdomen: soft, non-tender; bowel sounds normal; no masses,  no organomegaly Pelvic: cervix normal in appearance, external genitalia normal, no adnexal masses or tenderness, no cervical motion tenderness, rectovaginal septum normal, uterus normal size, shape, and consistency, and vagina normal without discharge Extremities: extremities normal, atraumatic, no cyanosis or edema Pulses: 2+ and symmetric Skin:  Healing ecchymosis noted along the right tricep and left medial thigh. Lymph nodes: Cervical, supraclavicular, and axillary nodes normal. Neurologic: Grossly normal Psych: Mood is stable.  Patient is pleasant and interactive  The 10-year ASCVD risk score Alyssa Bussing DC Jr., et al., 2013) is: 4.7%   Values used to calculate the score:     Age: 38 years     Sex: Female     Is Non-Hispanic African American: No     Diabetic: No     Tobacco smoker: No     Systolic Blood Pressure: 123XX123 mmHg     Is BP treated: No     HDL Cholesterol: 52 mg/dL     Total Cholesterol: 201 mg/dL   Assessment/ Plan: Alyssa Walters here for annual physical exam.   Annual physical exam  Chronic swimmer's ear of right side - Plan: neomycin-polymyxin-hydrocortisone (CORTISPORIN) 3.5-10000-1 OTIC suspension  Pure hypercholesterolemia - Plan: Lipid panel  Vaginal irritation - Plan: WET PREP FOR TRICH, YEAST, CLUE  Uses Spanish as primary spoken language  I question chronic otitis externa on the right.  She still has quite a bit of discharge noted on the external auditory canal today.  For this reason I am placing her on Cortisporin applied 4 times daily for the next week.  I have given her information on eustachian tube dysfunction which is likely what  is causing some of this intermittent dizziness and pain.  We reviewed her labs today which demonstrated ongoing and increasing LDL.  Reinforced diet and exercise.  I do not think that she is needing a cholesterol medicine yet.  Her ASCVD risk is 4.7%.  Nothing unusual appreciated on vaginal exam today.  Wet prep was obtained for completion and we will contact her with these results once available.  Spanish interpretation used as above.  I escorted her to the front to schedule her next visit with lab in 6 months.  Reinforced need for this to be fasting.  We will see her back in 1 year, sooner if needed  Counseled on healthy lifestyle choices, including diet (rich in fruits, vegetables and lean meats and low in salt and simple carbohydrates) and exercise (at least 30 minutes of moderate physical activity daily).  Patient to follow up in 1 year for annual exam or sooner  if needed.  Vinnie Bobst M. Lajuana Ripple, DO

## 2020-12-31 ENCOUNTER — Encounter: Payer: Self-pay | Admitting: Gastroenterology

## 2020-12-31 ENCOUNTER — Telehealth: Payer: Self-pay | Admitting: Gastroenterology

## 2020-12-31 ENCOUNTER — Ambulatory Visit: Payer: BC Managed Care – PPO | Admitting: Gastroenterology

## 2020-12-31 ENCOUNTER — Other Ambulatory Visit: Payer: Self-pay

## 2020-12-31 ENCOUNTER — Encounter: Payer: Self-pay | Admitting: *Deleted

## 2020-12-31 DIAGNOSIS — R109 Unspecified abdominal pain: Secondary | ICD-10-CM

## 2020-12-31 DIAGNOSIS — R112 Nausea with vomiting, unspecified: Secondary | ICD-10-CM | POA: Insufficient documentation

## 2020-12-31 DIAGNOSIS — K219 Gastro-esophageal reflux disease without esophagitis: Secondary | ICD-10-CM

## 2020-12-31 MED ORDER — PANTOPRAZOLE SODIUM 40 MG PO TBEC
40.0000 mg | DELAYED_RELEASE_TABLET | Freq: Every day | ORAL | 5 refills | Status: DC
Start: 2020-12-31 — End: 2021-01-14

## 2020-12-31 NOTE — Patient Instructions (Signed)
Start pantoprazole 40mg  once daily before breakfast.   Colonoscopy and upper endoscopy as scheduled. See separate instructions.    Comience con pantoprazol 40 mg una vez al da antes del desayuno.  Colonoscopia y endoscopia superior segn lo programado. Ver instrucciones separadas.

## 2020-12-31 NOTE — Progress Notes (Signed)
Primary Care Physician:  Ralene Bathe, NP  Primary Gastroenterologist:  Elon Alas. Abbey Chatters, DO   Chief Complaint  Patient presents with   Colonoscopy    Last tcs less than 10 years ago   Abdominal Pain    HPI:  Alyssa Walters is a 65 y.o. female here at the request of Hendricks Limes, NP for further evaluation of abdominal pain and for colonoscopy.  Patient seen with use of formal interpreter.  Daughter present as well.  Patient presents with numerous concerns today.  Complains of 1 year history of right-sided abdominal pain.  Pain will radiate into the right leg.  She is not sure of any alleviating factors.  Sometimes eating makes it hurt more.  Pain is worse when she urinates or has a BM.  Afterwards will have very short lived relief.  She has a lot of joint, back pain chronically.    Other GI symptoms include abdominal bloating/swelling which she feels is constant.  BMs daily but stools range from hard to loose.  No bright red blood per rectum.  Stools have been black but does have a history of Pepto-Bismol use.  She has a lot of indigestion.  Frequent vomiting. Worse at nights. Consuming over-the-counter antacids frequently with mild relief.  Tried to "clean up" her diet.  No dysphagia.Believes a lot of her symptoms are related to significant dietary changes since moving to the [place] 5 years ago.  She had 10 pound weight gain since living here.  With recent dietary changes, cutting back on sugar intake, she is lost 5 pounds.  Son is a physician in [place], he advised significant dietary changes.  Patient states she antibiotics recently for sinus infection, seem to help her back pain.  She has wondered if she had a urinary tract infection that maybe it is treated.  She had pelvic exam last month with PCP with unremarkable findings, negative Trichomonas/yeast/clue cells. Prior CT with multiple peripheral pulmonary nodules in the lung bases, chest CT advised.  Patient states she  was unaware of these findings.  She was concerned about her kidneys, based on the CT without contrast, kidneys within normal limits.  See CT findings as outlined below.  CT abdomen pelvis without contrast November 2021: 1.  No acute CT findings in the imaged abdomen or pelvis.  2.  Multiple peripheral pulmonary nodules in the imaged lung bases, some of which display central calcification suggestive of granulomas. Consider nonemergent outpatient CT chest for more complete characterization. 3. Few rounded fluid attenuating lesions in the liver, favored to represent simple hepatic cyst. 4.  Cholecystectomy. 5.  Appendix is not confidently visualized.  No inflammatory changes in the right lower quadrant.  Current Outpatient Medications  Medication Sig Dispense Refill   CALCIUM PO Take by mouth daily.     Omega-3 Fatty Acids (OMEGA 3 PO) Take by mouth daily.           VITAMIN D, CHOLECALCIFEROL, PO Take by mouth daily.     No current facility-administered medications for this visit.    Allergies as of 12/31/2020   (No Known Allergies)    Past Medical History:  Diagnosis Date   Gallbladder stone without cholecystitis or obstruction    Kidney stone    Migraines    Renal stone     Past Surgical History:  Procedure Laterality Date   BREAST SURGERY Left    cyst drained   CESAREAN SECTION     CHOLECYSTECTOMY  KIDNEY STONE SURGERY     LITHOTRIPSY      Family History  Problem Relation Age of Onset   Cancer Mother        breast   Arthritis Mother    Kidney disease Father        lung cancer   Cancer Sister        pancreatic   Clotting disorder Brother     Social History   Socioeconomic History   Marital status: Divorced    Spouse name: Not on file   Number of children: 5   Years of education: Not on file   Highest education level: Not on file  Occupational History   Not on file  Tobacco Use   Smoking status: Never    Passive exposure: Yes   Smokeless tobacco:  Never  Vaping Use   Vaping Use: Never used  Substance and Sexual Activity   Alcohol use: Not Currently    Alcohol/week: 1.0 standard drink    Types: 1 Glasses of wine per week   Drug use: Never   Sexual activity: Not Currently  Other Topics Concern   Not on file  Social History Narrative   Moved from [place].  Resides in [place].   Has 5 children, 1 lives in [place] and works as a Tax adviser.   1 daughter in [place]   Social Determinants of Health   Financial Resource Strain: Not on file  Food Insecurity: Not on file  Transportation Needs: Not on file  Physical Activity: Not on file  Stress: Not on file  Social Connections: Not on file  Intimate Partner Violence: Not on file      ROS:  General: Negative for anorexia, unintentional weight loss, fever, chills, fatigue, weakness. Eyes: Negative for vision changes.  ENT: Negative for hoarseness, difficulty swallowing, nasal congestion. CV: Negative for chest pain, angina, palpitations, dyspnea on exertion, peripheral edema.  Respiratory: Negative for dyspnea at rest, dyspnea on exertion, cough, sputum, wheezing.  GI: See history of present illness. GU:  Negative for dysuria, hematuria, urinary incontinence, urinary frequency, nocturnal urination.  MS: positive for joint pain, low back pain.  Derm: Negative for rash or itching.  Neuro: Negative for weakness, abnormal sensation, seizure, frequent headaches, memory loss, confusion.  Psych: Negative for anxiety, depression, suicidal ideation, hallucinations.  Endo: Negative for unusual weight change.  Heme: Negative for bruising or bleeding. Allergy: Negative for rash or hives.    Physical Examination:  BP 136/79   Pulse 68   Temp (!) 97.3 F (36.3 C) (Temporal)   Ht 5\' 3"  (1.6 m)   Wt 137 lb 12.8 oz (62.5 kg)   BMI 24.41 kg/m    General: Well-nourished, well-developed in no acute distress.  Head: Normocephalic, atraumatic.   Eyes: Conjunctiva pink, no  icterus. Mouth: masked Neck: Supple without thyromegaly, masses, or lymphadenopathy.  Lungs: Clear to auscultation bilaterally.  Heart: Regular rate and rhythm, no murmurs rubs or gallops.  Abdomen: Bowel sounds are normal,   nondistended, no hepatosplenomegaly or masses, no abdominal bruits or    hernia , no rebound or guarding.  Right sided abdominal tenderness/epigastric tenderness. Rectal: not performed Extremities: No lower extremity edema. No clubbing or deformities.  Neuro: Alert and oriented x 4 , grossly normal neurologically.  Skin: Warm and dry, no rash or jaundice.   Psych: Alert and cooperative, normal mood and affect.  Labs: Lab Results  Component Value Date   CREATININE 0.75 11/18/2020   BUN 15 11/18/2020  NA 142 11/18/2020   K 4.6 11/18/2020   CL 106 11/18/2020   CO2 25 11/18/2020   Lab Results  Component Value Date   ALT 13 11/18/2020   AST 16 11/18/2020   ALKPHOS 104 11/18/2020   BILITOT 0.4 11/18/2020   Lab Results  Component Value Date   WBC 7.2 09/06/2020   HGB 14.0 09/06/2020   HCT 41.2 09/06/2020   MCV 83 09/06/2020   PLT 278 09/06/2020   No results found for: LIPASE Lab Results  Component Value Date   TSH 1.790 09/06/2020   Lab Results  Component Value Date   IRON 47 09/06/2020   TIBC 289 09/06/2020   FERRITIN 53 09/06/2020     Imaging Studies: No results found.   Assessment:  Right-sided abdominal pain: Occurring for 1 year.  Etiology unclear, suspect some component GI related.  Symptoms worse with meals.  BMs regular consistency of stool irregular.  Discussed at length with patient and daughter today.  She also has other symptoms which may not be related to gastrointestinal etiology.  She complains of radiation of pain down her right leg which may be related to pinched nerve or back issue.  She complains of significant chronic back pain but does not sound like she has had significant work-up thusShe has had unremarkable pelvic exam.   CT May 2021 without contrast failed to explain her pain.  Appendix was not definitively identified, she denies prior appendectomy.  No evidence of right lower quadrant stranding on that study.  She was having similar pain at that time as well.  GERD: Poorly controlled with over-the-counter antacids.  Frequent nighttime symptoms.  Has tried to make significant dietary changes to help.Complains of frequent vomiting.  Daily nausea.  Etiology unclear, possibly refractory GERD.  Cannot exclude gastritis or peptic ulcer disease.  Colon cancer screening: No prior colonoscopy.  Family history of colon cancer as outlined.  BMs daily but she complains of range of [place] 1 through 7.  Pulmonary nodules: Advised patient to follow-up with PCP regarding further management.   Plan: Start pantoprazole 40 mg once daily. Colonoscopy and upper endoscopy with Dr. Abbey Chatters. ASA I.  I have discussed the risks, alternatives, benefits with regards to but not limited to the risk of reaction to medication, bleeding, infection, perforation and the patient is agreeable to proceed. Written consent to be obtained. If colonoscopy and EGD are unremarkable, she may benefit from repeat CT abdomen pelvis with contrast. Follow-up with PCP regarding pulmonary nodules and recommendation for chest CT as outlined by radiologist.

## 2020-12-31 NOTE — Telephone Encounter (Signed)
PA for pantoprazole sodium 40 MG dr tablets was approved from 12/31/2020 through 12/31/2021. Approval letter will be scanned into patient's chart.

## 2020-12-31 NOTE — H&P (View-Only) (Signed)
Primary Care Physician:  Ralene Bathe, NP  Primary Gastroenterologist:  Elon Alas. Abbey Chatters, DO   Chief Complaint  Patient presents with   Colonoscopy    Last tcs less than 10 years ago   Abdominal Pain    HPI:  Alyssa Walters is a 65 y.o. female here at the request of Hendricks Limes, NP for further evaluation of abdominal pain and for colonoscopy.  Patient seen with use of formal interpreter.  Daughter present as well.  Patient presents with numerous concerns today.  Complains of 1 year history of right-sided abdominal pain.  Pain will radiate into the right leg.  She is not sure of any alleviating factors.  Sometimes eating makes it hurt more.  Pain is worse when she urinates or has a BM.  Afterwards will have very short lived relief.  She has a lot of joint, back pain chronically.    Other GI symptoms include abdominal bloating/swelling which she feels is constant.  BMs daily but stools range from hard to loose.  No bright red blood per rectum.  Stools have been black but does have a history of Pepto-Bismol use.  She has a lot of indigestion.  Frequent vomiting. Worse at nights. Consuming over-the-counter antacids frequently with mild relief.  Tried to "clean up" her diet.  No dysphagia.Believes a lot of her symptoms are related to significant dietary changes since moving to the [place] 5 years ago.  She had 10 pound weight gain since living here.  With recent dietary changes, cutting back on sugar intake, she is lost 5 pounds.  Son is a physician in [place], he advised significant dietary changes.  Patient states she antibiotics recently for sinus infection, seem to help her back pain.  She has wondered if she had a urinary tract infection that maybe it is treated.  She had pelvic exam last month with PCP with unremarkable findings, negative Trichomonas/yeast/clue cells. Prior CT with multiple peripheral pulmonary nodules in the lung bases, chest CT advised.  Patient states she  was unaware of these findings.  She was concerned about her kidneys, based on the CT without contrast, kidneys within normal limits.  See CT findings as outlined below.  CT abdomen pelvis without contrast November 2021: 1.  No acute CT findings in the imaged abdomen or pelvis.  2.  Multiple peripheral pulmonary nodules in the imaged lung bases, some of which display central calcification suggestive of granulomas. Consider nonemergent outpatient CT chest for more complete characterization. 3. Few rounded fluid attenuating lesions in the liver, favored to represent simple hepatic cyst. 4.  Cholecystectomy. 5.  Appendix is not confidently visualized.  No inflammatory changes in the right lower quadrant.  Current Outpatient Medications  Medication Sig Dispense Refill   CALCIUM PO Take by mouth daily.     Omega-3 Fatty Acids (OMEGA 3 PO) Take by mouth daily.           VITAMIN D, CHOLECALCIFEROL, PO Take by mouth daily.     No current facility-administered medications for this visit.    Allergies as of 12/31/2020   (No Known Allergies)    Past Medical History:  Diagnosis Date   Gallbladder stone without cholecystitis or obstruction    Kidney stone    Migraines    Renal stone     Past Surgical History:  Procedure Laterality Date   BREAST SURGERY Left    cyst drained   CESAREAN SECTION     CHOLECYSTECTOMY  KIDNEY STONE SURGERY     LITHOTRIPSY      Family History  Problem Relation Age of Onset   Cancer Mother        breast   Arthritis Mother    Kidney disease Father        lung cancer   Cancer Sister        pancreatic   Clotting disorder Brother     Social History   Socioeconomic History   Marital status: Divorced    Spouse name: Not on file   Number of children: 5   Years of education: Not on file   Highest education level: Not on file  Occupational History   Not on file  Tobacco Use   Smoking status: Never    Passive exposure: Yes   Smokeless tobacco:  Never  Vaping Use   Vaping Use: Never used  Substance and Sexual Activity   Alcohol use: Not Currently    Alcohol/week: 1.0 standard drink    Types: 1 Glasses of wine per week   Drug use: Never   Sexual activity: Not Currently  Other Topics Concern   Not on file  Social History Narrative   Moved from [place].  Resides in [place].   Has 5 children, 1 lives in [place] and works as a Tax adviser.   1 daughter in [place]   Social Determinants of Health   Financial Resource Strain: Not on file  Food Insecurity: Not on file  Transportation Needs: Not on file  Physical Activity: Not on file  Stress: Not on file  Social Connections: Not on file  Intimate Partner Violence: Not on file      ROS:  General: Negative for anorexia, unintentional weight loss, fever, chills, fatigue, weakness. Eyes: Negative for vision changes.  ENT: Negative for hoarseness, difficulty swallowing, nasal congestion. CV: Negative for chest pain, angina, palpitations, dyspnea on exertion, peripheral edema.  Respiratory: Negative for dyspnea at rest, dyspnea on exertion, cough, sputum, wheezing.  GI: See history of present illness. GU:  Negative for dysuria, hematuria, urinary incontinence, urinary frequency, nocturnal urination.  MS: positive for joint pain, low back pain.  Derm: Negative for rash or itching.  Neuro: Negative for weakness, abnormal sensation, seizure, frequent headaches, memory loss, confusion.  Psych: Negative for anxiety, depression, suicidal ideation, hallucinations.  Endo: Negative for unusual weight change.  Heme: Negative for bruising or bleeding. Allergy: Negative for rash or hives.    Physical Examination:  BP 136/79   Pulse 68   Temp (!) 97.3 F (36.3 C) (Temporal)   Ht 5\' 3"  (1.6 m)   Wt 137 lb 12.8 oz (62.5 kg)   BMI 24.41 kg/m    General: Well-nourished, well-developed in no acute distress.  Head: Normocephalic, atraumatic.   Eyes: Conjunctiva pink, no  icterus. Mouth: masked Neck: Supple without thyromegaly, masses, or lymphadenopathy.  Lungs: Clear to auscultation bilaterally.  Heart: Regular rate and rhythm, no murmurs rubs or gallops.  Abdomen: Bowel sounds are normal,   nondistended, no hepatosplenomegaly or masses, no abdominal bruits or    hernia , no rebound or guarding.  Right sided abdominal tenderness/epigastric tenderness. Rectal: not performed Extremities: No lower extremity edema. No clubbing or deformities.  Neuro: Alert and oriented x 4 , grossly normal neurologically.  Skin: Warm and dry, no rash or jaundice.   Psych: Alert and cooperative, normal mood and affect.  Labs: Lab Results  Component Value Date   CREATININE 0.75 11/18/2020   BUN 15 11/18/2020  NA 142 11/18/2020   K 4.6 11/18/2020   CL 106 11/18/2020   CO2 25 11/18/2020   Lab Results  Component Value Date   ALT 13 11/18/2020   AST 16 11/18/2020   ALKPHOS 104 11/18/2020   BILITOT 0.4 11/18/2020   Lab Results  Component Value Date   WBC 7.2 09/06/2020   HGB 14.0 09/06/2020   HCT 41.2 09/06/2020   MCV 83 09/06/2020   PLT 278 09/06/2020   No results found for: LIPASE Lab Results  Component Value Date   TSH 1.790 09/06/2020   Lab Results  Component Value Date   IRON 47 09/06/2020   TIBC 289 09/06/2020   FERRITIN 53 09/06/2020     Imaging Studies: No results found.   Assessment:  Right-sided abdominal pain: Occurring for 1 year.  Etiology unclear, suspect some component GI related.  Symptoms worse with meals.  BMs regular consistency of stool irregular.  Discussed at length with patient and daughter today.  She also has other symptoms which may not be related to gastrointestinal etiology.  She complains of radiation of pain down her right leg which may be related to pinched nerve or back issue.  She complains of significant chronic back pain but does not sound like she has had significant work-up thusShe has had unremarkable pelvic exam.   CT May 2021 without contrast failed to explain her pain.  Appendix was not definitively identified, she denies prior appendectomy.  No evidence of right lower quadrant stranding on that study.  She was having similar pain at that time as well.  GERD: Poorly controlled with over-the-counter antacids.  Frequent nighttime symptoms.  Has tried to make significant dietary changes to help.Complains of frequent vomiting.  Daily nausea.  Etiology unclear, possibly refractory GERD.  Cannot exclude gastritis or peptic ulcer disease.  Colon cancer screening: No prior colonoscopy.  Family history of colon cancer as outlined.  BMs daily but she complains of range of [place] 1 through 7.  Pulmonary nodules: Advised patient to follow-up with PCP regarding further management.   Plan: Start pantoprazole 40 mg once daily. Colonoscopy and upper endoscopy with Dr. Abbey Chatters. ASA I.  I have discussed the risks, alternatives, benefits with regards to but not limited to the risk of reaction to medication, bleeding, infection, perforation and the patient is agreeable to proceed. Written consent to be obtained. If colonoscopy and EGD are unremarkable, she may benefit from repeat CT abdomen pelvis with contrast. Follow-up with PCP regarding pulmonary nodules and recommendation for chest CT as outlined by radiologist.

## 2021-01-01 ENCOUNTER — Encounter: Payer: Self-pay | Admitting: Gastroenterology

## 2021-01-14 ENCOUNTER — Ambulatory Visit (HOSPITAL_COMMUNITY): Payer: BC Managed Care – PPO | Admitting: Anesthesiology

## 2021-01-14 ENCOUNTER — Other Ambulatory Visit: Payer: Self-pay

## 2021-01-14 ENCOUNTER — Encounter (HOSPITAL_COMMUNITY): Payer: Self-pay

## 2021-01-14 ENCOUNTER — Encounter (HOSPITAL_COMMUNITY)
Admission: RE | Disposition: A | Discharge status: Home / Self Care | Payer: Self-pay | Source: Home / Self Care | Attending: Internal Medicine

## 2021-01-14 ENCOUNTER — Ambulatory Visit (HOSPITAL_COMMUNITY)
Admission: RE | Admit: 2021-01-14 | Discharge: 2021-01-14 | Disposition: A | Discharge status: Home / Self Care | Payer: BC Managed Care – PPO | Attending: Internal Medicine | Admitting: Internal Medicine

## 2021-01-14 DIAGNOSIS — Z801 Family history of malignant neoplasm of trachea, bronchus and lung: Secondary | ICD-10-CM | POA: Diagnosis not present

## 2021-01-14 DIAGNOSIS — K219 Gastro-esophageal reflux disease without esophagitis: Secondary | ICD-10-CM | POA: Diagnosis not present

## 2021-01-14 DIAGNOSIS — Z79899 Other long term (current) drug therapy: Secondary | ICD-10-CM | POA: Insufficient documentation

## 2021-01-14 DIAGNOSIS — Z832 Family history of diseases of the blood and blood-forming organs and certain disorders involving the immune mechanism: Secondary | ICD-10-CM | POA: Diagnosis not present

## 2021-01-14 DIAGNOSIS — K648 Other hemorrhoids: Secondary | ICD-10-CM | POA: Insufficient documentation

## 2021-01-14 DIAGNOSIS — K2950 Unspecified chronic gastritis without bleeding: Secondary | ICD-10-CM | POA: Diagnosis not present

## 2021-01-14 DIAGNOSIS — Z800 Family history of malignant neoplasm of digestive organs: Secondary | ICD-10-CM | POA: Diagnosis not present

## 2021-01-14 DIAGNOSIS — D120 Benign neoplasm of cecum: Secondary | ICD-10-CM | POA: Insufficient documentation

## 2021-01-14 DIAGNOSIS — R1011 Right upper quadrant pain: Secondary | ICD-10-CM | POA: Insufficient documentation

## 2021-01-14 DIAGNOSIS — Z8261 Family history of arthritis: Secondary | ICD-10-CM | POA: Diagnosis not present

## 2021-01-14 DIAGNOSIS — D124 Benign neoplasm of descending colon: Secondary | ICD-10-CM | POA: Insufficient documentation

## 2021-01-14 DIAGNOSIS — R1030 Lower abdominal pain, unspecified: Secondary | ICD-10-CM | POA: Diagnosis not present

## 2021-01-14 DIAGNOSIS — D122 Benign neoplasm of ascending colon: Secondary | ICD-10-CM | POA: Insufficient documentation

## 2021-01-14 DIAGNOSIS — K2970 Gastritis, unspecified, without bleeding: Secondary | ICD-10-CM

## 2021-01-14 DIAGNOSIS — K635 Polyp of colon: Secondary | ICD-10-CM

## 2021-01-14 DIAGNOSIS — K5730 Diverticulosis of large intestine without perforation or abscess without bleeding: Secondary | ICD-10-CM | POA: Insufficient documentation

## 2021-01-14 HISTORY — PX: BIOPSY: SHX5522

## 2021-01-14 HISTORY — PX: POLYPECTOMY: SHX5525

## 2021-01-14 HISTORY — PX: ESOPHAGOGASTRODUODENOSCOPY (EGD) WITH PROPOFOL: SHX5813

## 2021-01-14 HISTORY — PX: COLONOSCOPY WITH PROPOFOL: SHX5780

## 2021-01-14 SURGERY — COLONOSCOPY WITH PROPOFOL
Anesthesia: General

## 2021-01-14 MED ORDER — LACTATED RINGERS IV SOLN: INTRAVENOUS | Status: DC

## 2021-01-14 MED ORDER — PANTOPRAZOLE SODIUM 40 MG PO TBEC: 40.0000 mg | DELAYED_RELEASE_TABLET | Freq: Two times a day (BID) | ORAL | 5 refills | Status: AC

## 2021-01-14 MED ORDER — LIDOCAINE HCL (CARDIAC) PF 100 MG/5ML IV SOSY
PREFILLED_SYRINGE | INTRAVENOUS | Status: DC | PRN
  Administered 2021-01-14: 50 mg via INTRAVENOUS

## 2021-01-14 MED ORDER — STERILE WATER FOR IRRIGATION IR SOLN
Status: DC | PRN
  Administered 2021-01-14: 300 mL

## 2021-01-14 MED ORDER — PROPOFOL 10 MG/ML IV BOLUS
INTRAVENOUS | Status: DC | PRN
  Administered 2021-01-14: 100 mg via INTRAVENOUS
  Administered 2021-01-14: 120 mg via INTRAVENOUS
  Administered 2021-01-14: 50 mg via INTRAVENOUS

## 2021-01-14 MED ORDER — PROPOFOL 500 MG/50ML IV EMUL
INTRAVENOUS | Status: DC | PRN
  Administered 2021-01-14: 150 ug/kg/min via INTRAVENOUS

## 2021-01-14 NOTE — Anesthesia Preprocedure Evaluation (Addendum)
Anesthesia Evaluation  Patient identified by MRN, date of birth, ID band Patient awake    Reviewed: Allergy & Precautions, H&P , NPO status , Patient's Chart, lab work & pertinent test results, reviewed documented beta blocker date and time   Airway Mallampati: II  TM Distance: >3 FB Neck ROM: full    Dental  (+) Dental Advisory Given, Chipped, Missing, Poor Dentition   Pulmonary neg pulmonary ROS,    Pulmonary exam normal breath sounds clear to auscultation       Cardiovascular Exercise Tolerance: Good negative cardio ROS   Rhythm:regular Rate:Normal     Neuro/Psych  Headaches, negative psych ROS   GI/Hepatic Neg liver ROS, GERD  Medicated,  Endo/Other  negative endocrine ROS  Renal/GU Renal disease  negative genitourinary   Musculoskeletal   Abdominal   Peds  Hematology negative hematology ROS (+)   Anesthesia Other Findings   Reproductive/Obstetrics negative OB ROS                            Anesthesia Physical Anesthesia Plan  ASA: 2  Anesthesia Plan: General   Post-op Pain Management:    Induction:   PONV Risk Score and Plan: Propofol infusion  Airway Management Planned:   Additional Equipment:   Intra-op Plan:   Post-operative Plan:   Informed Consent: I have reviewed the patients History and Physical, chart, labs and discussed the procedure including the risks, benefits and alternatives for the proposed anesthesia with the patient or authorized representative who has indicated his/her understanding and acceptance.     Dental Advisory Given  Plan Discussed with: CRNA  Anesthesia Plan Comments:         Anesthesia Quick Evaluation

## 2021-01-14 NOTE — Op Note (Signed)
[facility] Patient Name: Alyssa Walters Procedure Date: 01/14/2021 9:37 AM MRN: [id number] Date of Birth: [birth year] Attending MD: Elon Alas. Abbey Chatters DO CSN: [id number] Age: 65 Admit Type: Outpatient Procedure:                Upper GI endoscopy Indications:              Abdominal pain in the right upper quadrant Providers:                Elon Alas. Abbey Chatters, DO, Lambert Mody, Dereck Leep, Technician Referring MD:              Medicines:                See the Anesthesia note for documentation of the                            administered medications Complications:            No immediate complications. Estimated Blood Loss:     Estimated blood loss was minimal. Procedure:                Pre-Anesthesia Assessment:                           - The anesthesia plan was to use monitored                            anesthesia care (MAC).                           After obtaining informed consent, the endoscope was                            passed under direct vision. Throughout the                            procedure, the patient's blood pressure, pulse, and                            oxygen saturations were monitored continuously. The                            [id number]-H190 ([id number]) scope was introduced through the                            mouth, and advanced to the second part of duodenum.                            The upper GI endoscopy was accomplished without                            difficulty. The patient tolerated the procedure  well. Scope In: 9:54:16 AM Scope Out: 9:58:17 AM Total Procedure Duration: 0 hours 4 minutes 1 second  Findings:      There is no endoscopic evidence of bleeding, areas of erosion,       esophagitis, ulcerations or varices in the entire esophagus.      The Z-line was regular and was found 38 cm from the incisors.      Localized moderate inflammation characterized by erosions and  erythema       was found in the gastric fundus and in the gastric body. Biopsies were       taken with a cold forceps for Helicobacter pylori testing.      The duodenal bulb, first portion of the duodenum and second portion of       the duodenum were normal. Impression:               - Z-line regular, 38 cm from the incisors.                           - Gastritis. Biopsied.                           - Normal duodenal bulb, first portion of the                            duodenum and second portion of the duodenum. Moderate Sedation:      Per Anesthesia Care Recommendation:           - Patient has a contact number available for                            emergencies. The signs and symptoms of potential                            delayed complications were discussed with the                            patient. Return to normal activities tomorrow.                            Written discharge instructions were provided to the                            patient.                           - Resume previous diet.                           - Continue present medications.                           - Await pathology results.                           - Use a proton pump inhibitor PO BID for 12 weeks.  Treat for H pylori if biopsies positive.                           - Return to GI clinic in 4 months. Procedure Code(s):        --- Professional ---                           [phone], Esophagogastroduodenoscopy, flexible,                            transoral; with biopsy, single or multiple Diagnosis Code(s):        --- Professional ---                           K29.70, Gastritis, unspecified, without bleeding                           R10.11, Right upper quadrant pain CPT copyright 2019 American Medical Association. All rights reserved. The codes documented in this report are preliminary and upon coder review may  be revised to meet current compliance  requirements. Elon Alas. Abbey Chatters, DO [place], DO 01/14/2021 10:00:20 AM This report has been signed electronically. Number of Addenda: 0

## 2021-01-14 NOTE — Transfer of Care (Signed)
Immediate Anesthesia Transfer of Care Note  Patient: Alyssa Walters  Procedure(s) Performed: COLONOSCOPY WITH PROPOFOL ESOPHAGOGASTRODUODENOSCOPY (EGD) WITH PROPOFOL BIOPSY POLYPECTOMY  Patient Location: Endoscopy Unit  Anesthesia Type:General  Level of Consciousness: awake  Airway & Oxygen Therapy: Patient Spontanous Breathing  Post-op Assessment: Report given to RN and Post -op Vital signs reviewed and stable  Post vital signs: Reviewed and stable  Last Vitals:  Vitals Value Taken Time  BP    Temp    Pulse    Resp    SpO2      Last Pain:  Vitals:   01/14/21 0952  TempSrc:   PainSc: 0-No pain      Patients Stated Pain Goal: 8 (43/20/03 7944)  Complications: No notable events documented.

## 2021-01-14 NOTE — Op Note (Signed)
[facility] Patient Name: Alyssa Walters Procedure Date: 01/14/2021 10:01 AM MRN: [id number] Date of Birth: October 16, 1955 Attending MD: Elon Alas. Abbey Chatters DO CSN: [id number] Age: 65 Admit Type: Outpatient Procedure:                Colonoscopy Indications:              Lower abdominal pain Providers:                Elon Alas. Abbey Chatters, DO, Lambert Mody, Dereck Leep, Technician Referring MD:              Medicines:                See the Anesthesia note for documentation of the                            administered medications Complications:            No immediate complications. Estimated Blood Loss:     Estimated blood loss was minimal. Procedure:                Pre-Anesthesia Assessment:                           - The anesthesia plan was to use monitored                            anesthesia care (MAC).                           After obtaining informed consent, the colonoscope                            was passed under direct vision. Throughout the                            procedure, the patient's blood pressure, pulse, and                            oxygen saturations were monitored continuously. The                            [id number]-HQ190L ([id number]) scope was introduced through                            the anus and advanced to the the cecum, identified                            by appendiceal orifice and ileocecal valve. The                            colonoscopy was performed without difficulty. The                            patient tolerated the procedure well. The quality  of the bowel preparation was evaluated using the                            BBPS [facility] Bowel Preparation Scale) with scores                            of: Right Colon = 3, Transverse Colon = 3 and Left                            Colon = 3 (entire mucosa seen well with no residual                            staining, small fragments  of stool or opaque                            liquid). The total BBPS score equals 9. Scope In: 10:03:01 AM Scope Out: 10:17:56 AM Scope Withdrawal Time: 0 hours 9 minutes 53 seconds  Total Procedure Duration: 0 hours 14 minutes 55 seconds  Findings:      The perianal and digital rectal examinations were normal.      Non-bleeding internal hemorrhoids were found during endoscopy.      A few small-mouthed diverticula were found in the sigmoid colon.      Three sessile polyps were found in the ascending colon and cecum. The       polyps were 2 to 3 mm in size. These polyps were removed with a cold       snare. Resection and retrieval were complete.      A 4 mm polyp was found in the descending colon. The polyp was sessile.       The polyp was removed with a cold snare. Resection and retrieval were       complete.      Biopsies for histology were taken with a cold forceps from the ascending       colon, transverse colon and descending colon for evaluation of       microscopic colitis. Impression:               - Non-bleeding internal hemorrhoids.                           - Diverticulosis in the sigmoid colon.                           - Three 2 to 3 mm polyps in the ascending colon and                            in the cecum, removed with a cold snare. Resected                            and retrieved.                           - One 4 mm polyp in the descending colon, removed  with a cold snare. Resected and retrieved.                           - Biopsies were taken with a cold forceps from the                            ascending colon, transverse colon and descending                            colon for evaluation of microscopic colitis. Moderate Sedation:      Per Anesthesia Care Recommendation:           - Patient has a contact number available for                            emergencies. The signs and symptoms of potential                             delayed complications were discussed with the                            patient. Return to normal activities tomorrow.                            Written discharge instructions were provided to the                            patient.                           - Resume previous diet.                           - Continue present medications.                           - Await pathology results.                           - Repeat colonoscopy in 5-10 years for surveillance.                           - Return to GI clinic in 4 months. Procedure Code(s):        --- Professional ---                           [phone], Colonoscopy, flexible; with removal of                            tumor(s), polyp(s), or other lesion(s) by snare                            technique                           03500, 96, Colonoscopy, flexible; with biopsy,  single or multiple Diagnosis Code(s):        --- Professional ---                           K64.8, Other hemorrhoids                           K63.5, Polyp of colon                           R10.30, Lower abdominal pain, unspecified                           K57.30, Diverticulosis of large intestine without                            perforation or abscess without bleeding CPT copyright 2019 American Medical Association. All rights reserved. The codes documented in this report are preliminary and upon coder review may  be revised to meet current compliance requirements. Elon Alas. Abbey Chatters, DO [place], DO 01/14/2021 10:27:04 AM This report has been signed electronically. Number of Addenda: 0

## 2021-01-14 NOTE — Interval H&P Note (Signed)
History and Physical Interval Note:  01/14/2021 9:39 AM  Alyssa Walters  has presented today for surgery, with the diagnosis of right sided abd pain, gerd, nausea, vomiting.  The various methods of treatment have been discussed with the patient and family. After consideration of risks, benefits and other options for treatment, the patient has consented to  Procedure(s) with comments: COLONOSCOPY WITH PROPOFOL (N/A) - 9:45am ESOPHAGOGASTRODUODENOSCOPY (EGD) WITH PROPOFOL (N/A) as a surgical intervention.  The patient's history has been reviewed, patient examined, no change in status, stable for surgery.  I have reviewed the patient's chart and labs.  Questions were answered to the patient's satisfaction.     Eloise Harman

## 2021-01-14 NOTE — Anesthesia Postprocedure Evaluation (Signed)
Anesthesia Post Note  Patient: Alyssa Walters  Procedure(s) Performed: COLONOSCOPY WITH PROPOFOL ESOPHAGOGASTRODUODENOSCOPY (EGD) WITH PROPOFOL BIOPSY POLYPECTOMY  Patient location during evaluation: Phase II Anesthesia Type: General Level of consciousness: awake Pain management: pain level controlled Vital Signs Assessment: post-procedure vital signs reviewed and stable Respiratory status: spontaneous breathing and respiratory function stable Cardiovascular status: blood pressure returned to baseline and stable Postop Assessment: no headache and no apparent nausea or vomiting Anesthetic complications: no Comments: Late entry   No notable events documented.   Last Vitals:  Vitals:   01/14/21 1022 01/14/21 1024  BP: (!) 88/51 96/69  Pulse: 85 88  Resp: 18 18  Temp: 36.6 C   SpO2: 98% 97%    Last Pain:  Vitals:   01/14/21 1022  TempSrc: Oral  PainSc: 0-No pain                 Louann Sjogren

## 2021-01-14 NOTE — Discharge Instructions (Addendum)
EGD Discharge instructions Please read the instructions outlined below and refer to this sheet in the next few weeks. These discharge instructions provide you with general information on caring for yourself after you leave the hospital. Your doctor may also give you specific instructions. While your treatment has been planned according to the most current medical practices available, unavoidable complications occasionally occur. If you have any problems or questions after discharge, please call your doctor. ACTIVITY You may resume your regular activity but move at a slower pace for the next 24 hours.  Take frequent rest periods for the next 24 hours.  Walking will help expel (get rid of) the air and reduce the bloated feeling in your abdomen.  No driving for 24 hours (because of the anesthesia (medicine) used during the test).  You may shower.  Do not sign any important legal documents or operate any machinery for 24 hours (because of the anesthesia used during the test).  NUTRITION Drink plenty of fluids.  You may resume your normal diet.  Begin with a light meal and progress to your normal diet.  Avoid alcoholic beverages for 24 hours or as instructed by your caregiver.  MEDICATIONS You may resume your normal medications unless your caregiver tells you otherwise.  WHAT YOU CAN EXPECT TODAY You may experience abdominal discomfort such as a feeling of fullness or "gas" pains.  FOLLOW-UP Your doctor will discuss the results of your test with you.  SEEK IMMEDIATE MEDICAL ATTENTION IF ANY OF THE FOLLOWING OCCUR: Excessive nausea (feeling sick to your stomach) and/or vomiting.  Severe abdominal pain and distention (swelling).  Trouble swallowing.  Temperature over 101 F (37.8 C).  Rectal bleeding or vomiting of blood.     Colonoscopy Discharge Instructions  Read the instructions outlined below and refer to this sheet in the next few weeks. These discharge instructions provide you with  general information on caring for yourself after you leave the hospital. Your doctor may also give you specific instructions. While your treatment has been planned according to the most current medical practices available, unavoidable complications occasionally occur.   ACTIVITY You may resume your regular activity, but move at a slower pace for the next 24 hours.  Take frequent rest periods for the next 24 hours.  Walking will help get rid of the air and reduce the bloated feeling in your belly (abdomen).  No driving for 24 hours (because of the medicine (anesthesia) used during the test).   Do not sign any important legal documents or operate any machinery for 24 hours (because of the anesthesia used during the test).  NUTRITION Drink plenty of fluids.  You may resume your normal diet as instructed by your doctor.  Begin with a light meal and progress to your normal diet. Heavy or fried foods are harder to digest and may make you feel sick to your stomach (nauseated).  Avoid alcoholic beverages for 24 hours or as instructed.  MEDICATIONS You may resume your normal medications unless your doctor tells you otherwise.  WHAT YOU CAN EXPECT TODAY Some feelings of bloating in the abdomen.  Passage of more gas than usual.  Spotting of blood in your stool or on the toilet paper.  IF YOU HAD POLYPS REMOVED DURING THE COLONOSCOPY: No aspirin products for 7 days or as instructed.  No alcohol for 7 days or as instructed.  Eat a soft diet for the next 24 hours.  FINDING OUT THE RESULTS OF YOUR TEST Not all test results are  available during your visit. If your test results are not back during the visit, make an appointment with your caregiver to find out the results. Do not assume everything is normal if you have not heard from your caregiver or the medical facility. It is important for you to follow up on all of your test results.  SEEK IMMEDIATE MEDICAL ATTENTION IF: You have more than a spotting of  blood in your stool.  Your belly is swollen (abdominal distention).  You are nauseated or vomiting.  You have a temperature over 101.  You have abdominal pain or discomfort that is severe or gets worse throughout the day.   Your EGD revealed mild amount inflammation in your stomach.  I took biopsies of this to rule out infection with a bacteria called H. pylori.  Await pathology results, my office will contact you.  I am going to increase your pantoprazole to 40 mg twice daily.  I have sent this to your pharmacy.  Your colonoscopy revealed 4 polyp(s) which I removed successfully. Await pathology results, my office will contact you. I recommend repeating colonoscopy in 5-10 years for surveillance purposes depending on pathology results.  I also took biopsies of your colon as well.  Follow-up with GI in 4 to 5 months.    I hope you have a great rest of your week!  Elon Alas. Abbey Chatters, D.O. Gastroenterology and Hepatology [facility]

## 2021-01-15 LAB — SURGICAL PATHOLOGY

## 2021-01-21 ENCOUNTER — Encounter (HOSPITAL_COMMUNITY): Payer: Self-pay | Admitting: Internal Medicine

## 2021-04-25 IMAGING — DX DG ABDOMEN 1V
2 series · 2 of 2 positions shown · non-contrast
Comparison: None.

CLINICAL DATA: Right lower quadrant abdominal pain.

EXAM:
ABDOMEN - 1 VIEW

[abdomen kub (1 of 2)]
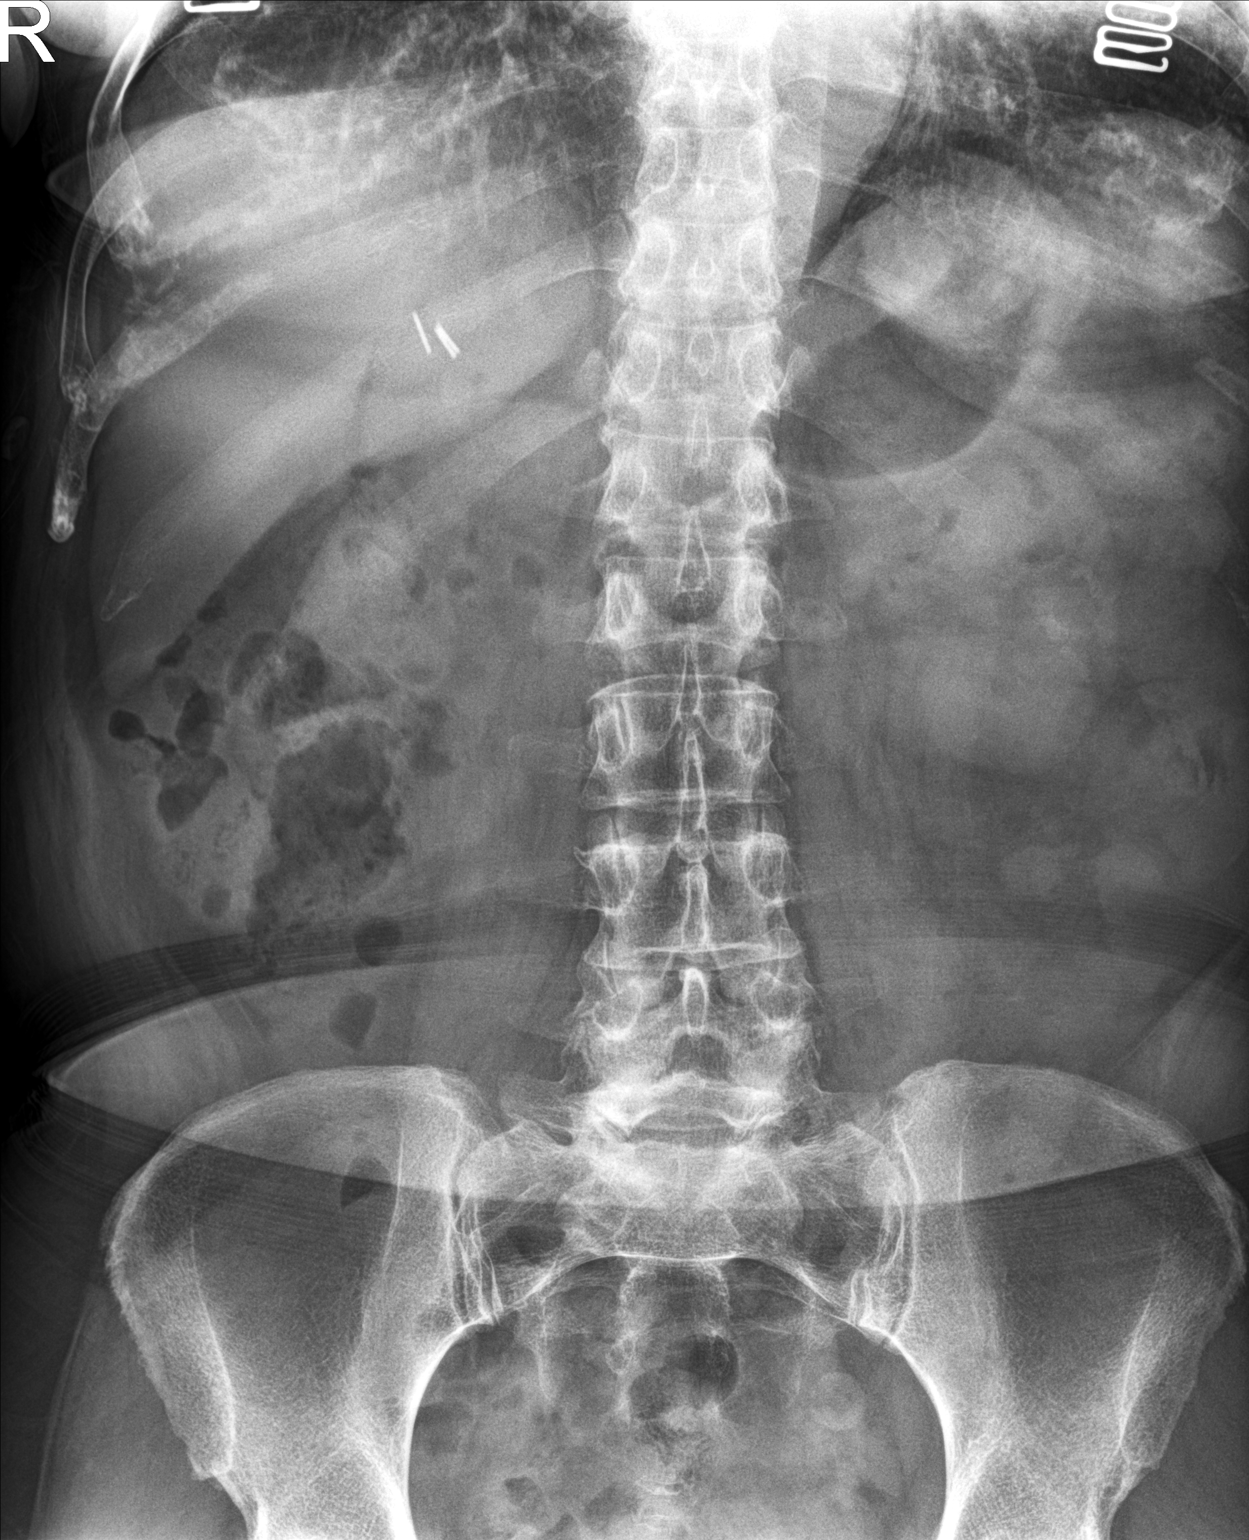

[abdomen kub (2 of 2)]
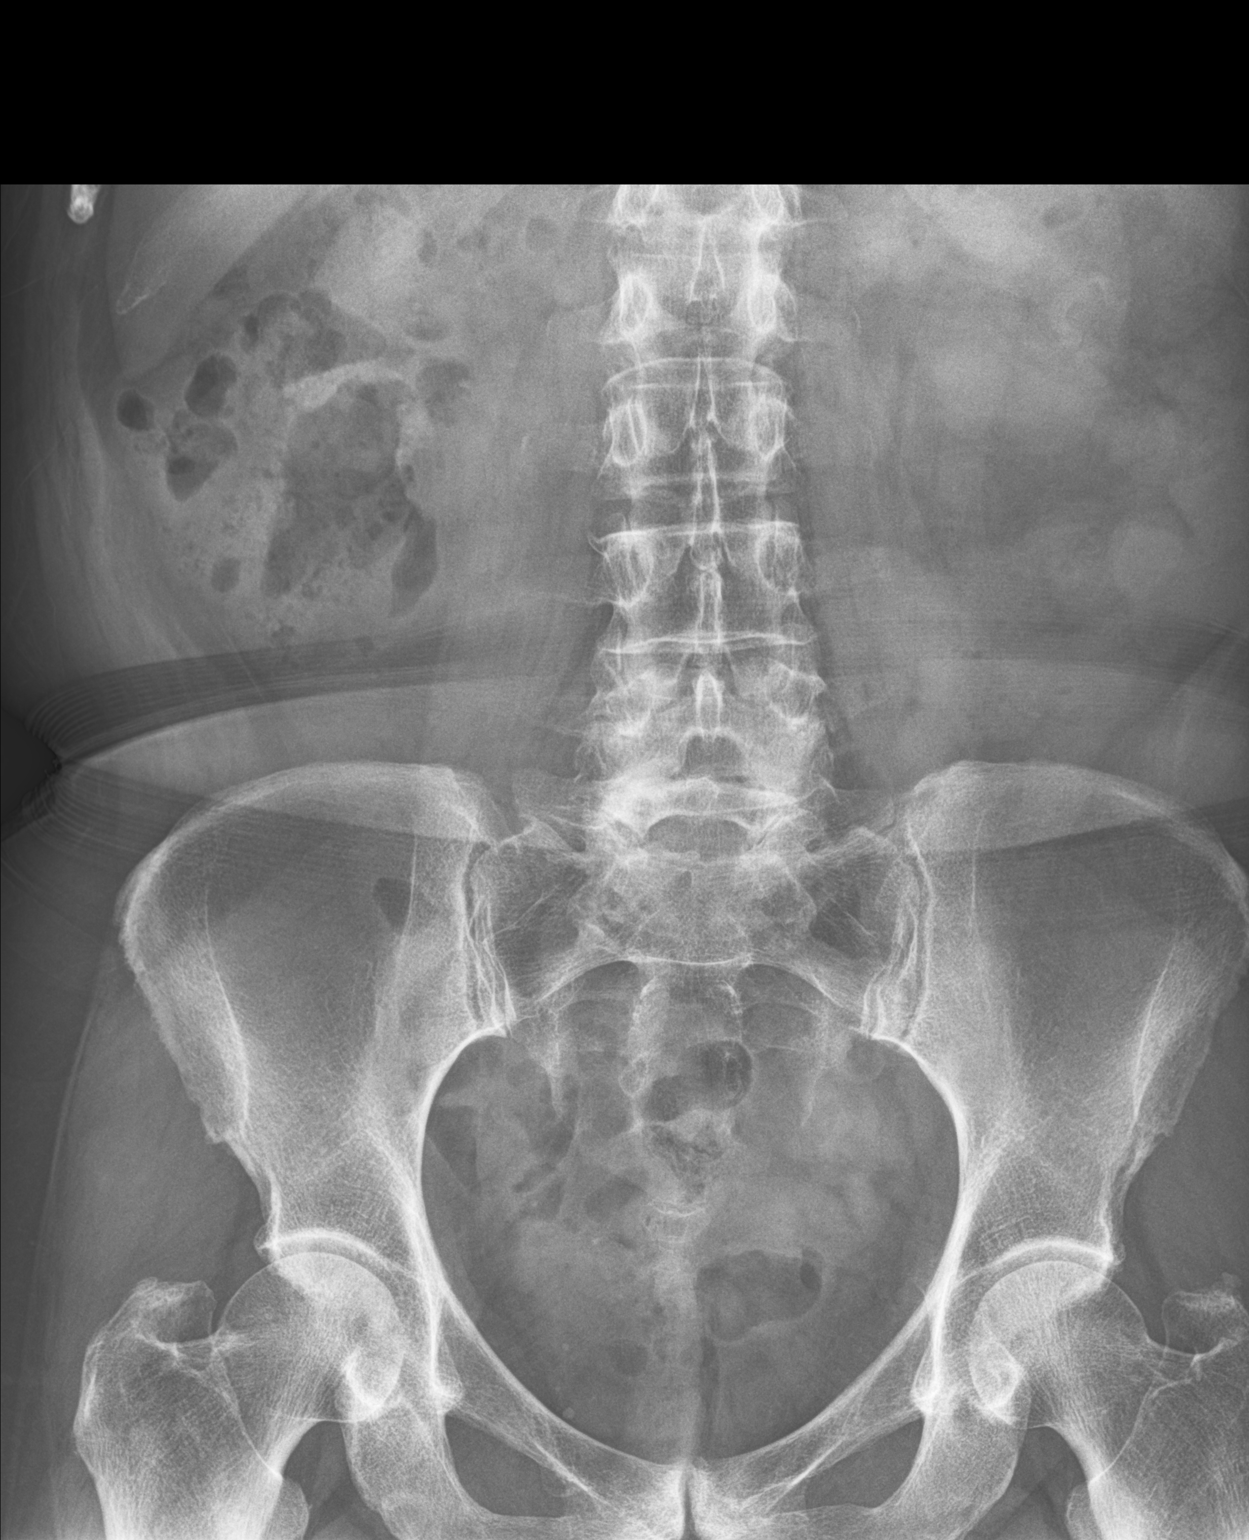

[2 of 2 positions shown; findings below may reference images not displayed]

FINDINGS: The bowel gas pattern is normal. No radio-opaque calculi or other
significant radiographic abnormality are seen. Status post
cholecystectomy.
IMPRESSION: Negative.

## 2021-05-22 ENCOUNTER — Other Ambulatory Visit: Payer: BC Managed Care – PPO

## 2021-06-04 NOTE — Progress Notes (Unsigned)
Referring Provider: Janora Norlander, DO Primary Care Physician:  Janora Norlander, DO Primary GI Physician: Dr. Abbey Chatters  No chief complaint on file.   HPI:   Alyssa Walters is a 66 y.o. female presenting today for follow-up of right-sided abdominal pain, nausea with vomiting, GERD.   Last seen in our office on 12/31/2020 at the time of initial consult for the same.  She reported 1 year history of right-sided abdominal pain radiating into right leg, sometimes worsened by eating, when urinating, or when having a bowel movement.  Short-lived relief thereafter.  Also with a lot of joint pain and back pain chronically.  Also complained of abdominal bloating/swelling constantly, BMs daily ranging from hard to loose without BRBPR.  Reported black stools but history of Pepto-Bismol use.  Also a lot of indigestion, frequent vomiting, worse at night, taking OTC antacids.  Felt a lot of her symptoms were related to dietary changes since moving to the [place] 5 years ago.  Had gained 10 pounds, but intentionally lost 5.  Noted pelvic exam last month with PCP with unremarkable findings, negative for trichomonas/yeast/clue cells.  Prior CT in May 2021 without findings to explain abdominal pain.  She is started on Protonix 40 mg daily, scheduled for colonoscopy and EGD.  Procedures 01/14/2021: Colonoscopy: Non-bleeding internal hemorrhoids, diverticulosis in sigmoid colon, three 2-3 mm polyps of ascending colon and cecum, one 4 mm polyp in the descending colon, biopsies taken to evaluate for microscopic colitis.  Pathology revealed sessile serrated polyp, hyperplastic polyp, tubular adenoma, and other fragment of polypoid colonic mucosa without histopathologic changes.  Colon biopsies were benign.  Recommended repeat in 5 years. EGD: Gastritis s/p biopsied.  Pathology revealed mild chronic gastritis, negative for H. pylori.  Recommended PPI twice daily x12 weeks.  Today:        Son is a Engineer, drilling in  [place].  Past Medical History:  Diagnosis Date   Gallbladder stone without cholecystitis or obstruction    Kidney stone    Migraines    Renal stone     Past Surgical History:  Procedure Laterality Date   BIOPSY  01/14/2021   Procedure: BIOPSY;  Surgeon: Eloise Harman, DO;  [facility];  Service: Endoscopy;;   BREAST SURGERY Left    cyst drained   CESAREAN SECTION     CHOLECYSTECTOMY     COLONOSCOPY WITH PROPOFOL N/A 01/14/2021   Procedure: COLONOSCOPY WITH PROPOFOL;  Surgeon: Eloise Harman, DO;  [facility];  Service: Endoscopy;  Laterality: N/A;  9:45am   ESOPHAGOGASTRODUODENOSCOPY (EGD) WITH PROPOFOL N/A 01/14/2021   Procedure: ESOPHAGOGASTRODUODENOSCOPY (EGD) WITH PROPOFOL;  Surgeon: Eloise Harman, DO;  [facility];  Service: Endoscopy;  Laterality: N/A;   KIDNEY STONE SURGERY     LITHOTRIPSY     POLYPECTOMY  01/14/2021   Procedure: POLYPECTOMY;  Surgeon: Eloise Harman, DO;  [facility];  Service: Endoscopy;;    Current Outpatient Medications  Medication Sig Dispense Refill   acetaminophen (TYLENOL) 500 MG tablet Take 500 mg by mouth every 6 (six) hours as needed.     Aspirin-Acetaminophen-Caffeine 500-325-65 MG PACK Take 1 tablet by mouth daily as needed (pain.).     COD LIVER OIL/VITAMINS A & D PO Take 1 capsule by mouth in the morning.     Omega-3 Fatty Acids (FISH OIL) 1000 MG CAPS Take 1,000 mg by mouth daily with lunch.     pantoprazole (PROTONIX) 40 MG tablet  Take 1 tablet (40 mg total) by mouth 2 (two) times daily before a meal. 60 tablet 5   vitamin E 180 MG (400 UNITS) capsule Take 400 Units by mouth daily with lunch.     No current facility-administered medications for this visit.    Allergies as of 06/05/2021 - Review Complete 01/14/2021  Allergen Reaction Noted   Iodine  01/14/2021    Family History  Problem Relation Age of Onset   Cancer Mother        breast   Arthritis Mother    Kidney  disease Father        lung cancer   Cancer Sister        pancreatic   Clotting disorder Brother    Colon cancer Cousin    Colon cancer Maternal Uncle     Social History   Socioeconomic History   Marital status: Divorced    Spouse name: Not on file   Number of children: 5   Years of education: Not on file   Highest education level: Not on file  Occupational History   Not on file  Tobacco Use   Smoking status: Never    Passive exposure: Yes   Smokeless tobacco: Never  Vaping Use   Vaping Use: Never used  Substance and Sexual Activity   Alcohol use: Not Currently    Alcohol/week: 1.0 standard drink    Types: 1 Glasses of wine per week   Drug use: Never   Sexual activity: Not Currently  Other Topics Concern   Not on file  Social History Narrative   Moved from [place].  Resides in [place].   Has 5 children, 1 lives in [place] and works as a Tax adviser.   1 daughter in [place]   Social Determinants of Health   Financial Resource Strain: Not on file  Food Insecurity: Not on file  Transportation Needs: Not on file  Physical Activity: Not on file  Stress: Not on file  Social Connections: Not on file    Review of Systems: Gen: Denies fever, chills, anorexia. Denies fatigue, weakness, weight loss.  CV: Denies chest pain, palpitations, syncope, peripheral edema, and claudication. Resp: Denies dyspnea at rest, cough, wheezing, coughing up blood, and pleurisy. GI: Denies vomiting blood, jaundice, and fecal incontinence.   Denies dysphagia or odynophagia. Derm: Denies rash, itching, dry skin Psych: Denies depression, anxiety, memory loss, confusion. No homicidal or suicidal ideation.  Heme: Denies bruising, bleeding, and enlarged lymph nodes.  Physical Exam: There were no vitals taken for this visit. General:   Alert and oriented. No distress noted. Pleasant and cooperative.  Head:  Normocephalic and atraumatic. Eyes:  Conjuctiva clear without scleral  icterus. Mouth:  Oral mucosa pink and moist. Good dentition. No lesions. Heart:  S1, S2 present without murmurs appreciated. Lungs:  Clear to auscultation bilaterally. No wheezes, rales, or rhonchi. No distress.  Abdomen:  +BS, soft, non-tender and non-distended. No rebound or guarding. No HSM or masses noted. Msk:  Symmetrical without gross deformities. Normal posture. Extremities:  Without edema. Neurologic:  Alert and  oriented x4 Psych:  Alert and cooperative. Normal mood and affect.

## 2021-06-05 ENCOUNTER — Ambulatory Visit: Payer: BC Managed Care – PPO | Admitting: Gastroenterology

## 2021-06-05 ENCOUNTER — Encounter: Payer: Self-pay | Admitting: Internal Medicine

## 2021-06-25 ENCOUNTER — Ambulatory Visit: Payer: BC Managed Care – PPO | Admitting: Family Medicine

## 2021-06-30 ENCOUNTER — Encounter: Payer: Self-pay | Admitting: Family Medicine

## 2021-09-25 ENCOUNTER — Other Ambulatory Visit (HOSPITAL_COMMUNITY): Payer: Self-pay | Admitting: Family Medicine

## 2021-09-25 DIAGNOSIS — Z1231 Encounter for screening mammogram for malignant neoplasm of breast: Secondary | ICD-10-CM

## 2021-10-20 ENCOUNTER — Ambulatory Visit (HOSPITAL_COMMUNITY)
Admission: RE | Admit: 2021-10-20 | Discharge: 2021-10-20 | Disposition: A | Discharge status: Home / Self Care | Payer: BC Managed Care – PPO | Source: Ambulatory Visit | Attending: Family Medicine | Admitting: Family Medicine

## 2021-10-20 DIAGNOSIS — Z1231 Encounter for screening mammogram for malignant neoplasm of breast: Secondary | ICD-10-CM | POA: Insufficient documentation

## 2021-11-21 ENCOUNTER — Encounter: Payer: BC Managed Care – PPO | Admitting: Family Medicine

## 2021-11-24 ENCOUNTER — Encounter: Payer: Self-pay | Admitting: Family Medicine

## 2022-06-01 DIAGNOSIS — Z6823 Body mass index (BMI) 23.0-23.9, adult: Secondary | ICD-10-CM | POA: Diagnosis not present

## 2022-06-01 DIAGNOSIS — Z0000 Encounter for general adult medical examination without abnormal findings: Secondary | ICD-10-CM | POA: Diagnosis not present

## 2022-06-02 LAB — LAB REPORT - SCANNED: EGFR: 91

## 2022-06-09 DIAGNOSIS — R109 Unspecified abdominal pain: Secondary | ICD-10-CM | POA: Diagnosis not present

## 2022-06-09 DIAGNOSIS — K7689 Other specified diseases of liver: Secondary | ICD-10-CM | POA: Diagnosis not present

## 2022-06-09 DIAGNOSIS — Z9049 Acquired absence of other specified parts of digestive tract: Secondary | ICD-10-CM | POA: Diagnosis not present

## 2022-07-15 ENCOUNTER — Other Ambulatory Visit (HOSPITAL_COMMUNITY)
Admission: RE | Admit: 2022-07-15 | Discharge: 2022-07-15 | Disposition: A | Discharge status: Home / Self Care | Payer: BC Managed Care – PPO | Source: Ambulatory Visit | Attending: Obstetrics & Gynecology | Admitting: Obstetrics & Gynecology

## 2022-07-15 ENCOUNTER — Encounter: Payer: Self-pay | Admitting: Obstetrics & Gynecology

## 2022-07-15 ENCOUNTER — Ambulatory Visit (INDEPENDENT_AMBULATORY_CARE_PROVIDER_SITE_OTHER): Payer: BC Managed Care – PPO | Admitting: Obstetrics & Gynecology

## 2022-07-15 VITALS — BP 127/80 | HR 88 | Ht 62.6 in | Wt 138.0 lb

## 2022-07-15 DIAGNOSIS — L292 Pruritus vulvae: Secondary | ICD-10-CM | POA: Diagnosis not present

## 2022-07-15 DIAGNOSIS — Z01419 Encounter for gynecological examination (general) (routine) without abnormal findings: Secondary | ICD-10-CM | POA: Diagnosis not present

## 2022-07-15 DIAGNOSIS — R102 Pelvic and perineal pain: Secondary | ICD-10-CM | POA: Diagnosis not present

## 2022-07-15 DIAGNOSIS — Z780 Asymptomatic menopausal state: Secondary | ICD-10-CM | POA: Diagnosis not present

## 2022-07-15 NOTE — Progress Notes (Signed)
WELL-WOMAN EXAMINATION Patient name: Alyssa Walters MRN [id number]  Date of birth: 10/21/1955 Chief Complaint:   Gynecologic Exam  History of Present Illness:   Alyssa Walters is a 67 y.o. G5P5 PM female being seen today for a routine well-woman exam.   Spanish interpreter present  Pt menopausal- no bleeding for several years though she does note she had a few yrs of occasional spotting, but that has since stopped.  Denies any bleeding currently.  Pelvic pain: Today she notes occasional pelvic pain- will come for a few days in a row then go away.  Recently has had this pain and feels like "menstrual pain" or pulsing pain.  No pain medication.  Rates her symptoms 4/10.  Not sure what makes it worse or better.  Notes h/o kidney issues- so she is not sure if it's related to this or not.  Notes regular BMs.  Not sexually active.  Vaginal itching: On occasion she notes external itching- using an OTC cream and symptoms resolve.  Denies vaginal itching or odor.  No other acute complaints.   No LMP recorded. Patient is postmenopausal.  Last pap 2021.  Last mammogram: 10/2021- reviewed concerns about dense breast tissue Last colonoscopy: 2022     09/06/2020   10:26 AM 06/23/2019    3:19 PM  Depression screen PHQ 2/9  Decreased Interest 0 0  Down, Depressed, Hopeless 0 0  PHQ - 2 Score 0 0  Altered sleeping 1 1  Tired, decreased energy 2 1  Change in appetite 0 0  Feeling bad or failure about yourself  0 2  Trouble concentrating 0 0  Moving slowly or fidgety/restless 0 0  Suicidal thoughts 0 0  PHQ-9 Score 3 4  Difficult doing work/chores Not difficult at all       Review of Systems:   Pertinent items are noted in HPI Denies any headaches, blurred vision, fatigue, shortness of breath, chest pain.  Pertinent History Reviewed:  Reviewed past medical,surgical, social and family history.  Reviewed problem list, medications and allergies. Physical Assessment:   Vitals:   07/15/22  1408  BP: 127/80  Pulse: 88  Weight: 138 lb (62.6 kg)  Height: 5' 2.6" (1.59 m)  Body mass index is 24.76 kg/m.        Physical Examination:   General appearance - well appearing, and in no distress  Mental status - alert, oriented to person, place, and time  Psych:  She has a normal mood and affect  Skin - warm and dry, normal color, no suspicious lesions noted  Chest - effort normal, all lung fields clear to auscultation bilaterally  Heart - normal rate and regular rhythm  Neck:  midline trachea, no thyromegaly or nodules  Breasts - breasts appear normal, no suspicious masses, no skin or nipple changes or  axillary nodes  Abdomen - soft, nontender, nondistended, no masses or organomegaly  Pelvic - VULVA: normal appearing vulva with no masses, tenderness or lesions  VAGINA: normal appearing vagina with normal color and discharge, no lesions  CERVIX: normal appearing cervix without discharge or lesions, no CMT. Atrophic changes noted  Thin prep pap is done with HR HPV cotesting  UTERUS: uterus is felt to be normal size, shape, consistency and nontender   ADNEXA: No adnexal masses or tenderness noted.  Extremities:  No swelling or varicosities noted  Chaperone: Celene Squibb     Assessment & Plan:  1) Well-Woman Exam -pap collected, reviewed ASCCP guidelines -prior to 2021 records in  [place] []  may consider pap in 3 yrs then no further testing indicated -mammogram due in July  2) Pelvic pain -no acute abnormalities noted on exam -plan for pelvic US to r/o underlying etiology -further management pending results  3) Vulvar itching -no acute abnormalities noted -discussed that pap will also check for underlying infection -ok to use OTC remedies prn  -Pt had concerns regarding "checking hormones" reviewed menopausal status and common symptoms.  Questions and concerns were addressed  Orders Placed This Encounter  Procedures   US PELVIC COMPLETE WITH TRANSVAGINAL     Meds: No orders of the defined types were placed in this encounter.   Follow-up: Return in about 1 year (around 07/15/2023) for Annual and scheduled pelvic US (ok for 51yr annual if desired).   Alyssa Pupa, DO Attending [place], [facility] for [employer], [place]

## 2022-07-17 LAB — CYTOLOGY - PAP
Comment: NEGATIVE
Diagnosis: NEGATIVE
High risk HPV: NEGATIVE

## 2022-07-22 ENCOUNTER — Telehealth: Payer: Self-pay

## 2022-07-22 NOTE — Telephone Encounter (Signed)
Found patient an ultrasound appointment, called to confirm the date/time would work. Interpreter called the patient's and daughter's phone and got no answer and couldn't leave a voicemail. Mc.

## 2022-07-29 ENCOUNTER — Other Ambulatory Visit: Payer: BC Managed Care – PPO

## 2022-11-30 DIAGNOSIS — G43119 Migraine with aura, intractable, without status migrainosus: Secondary | ICD-10-CM | POA: Diagnosis not present

## 2022-11-30 DIAGNOSIS — Z6824 Body mass index (BMI) 24.0-24.9, adult: Secondary | ICD-10-CM | POA: Diagnosis not present

## 2022-12-03 ENCOUNTER — Other Ambulatory Visit (HOSPITAL_COMMUNITY): Payer: Self-pay | Admitting: Internal Medicine

## 2022-12-03 DIAGNOSIS — Z1231 Encounter for screening mammogram for malignant neoplasm of breast: Secondary | ICD-10-CM

## 2022-12-04 DIAGNOSIS — G43119 Migraine with aura, intractable, without status migrainosus: Secondary | ICD-10-CM | POA: Diagnosis not present

## 2022-12-04 DIAGNOSIS — Z13220 Encounter for screening for lipoid disorders: Secondary | ICD-10-CM | POA: Diagnosis not present

## 2022-12-04 DIAGNOSIS — Z131 Encounter for screening for diabetes mellitus: Secondary | ICD-10-CM | POA: Diagnosis not present

## 2022-12-05 LAB — LAB REPORT - SCANNED
A1c: 5.8
EGFR: 71

## 2022-12-10 ENCOUNTER — Encounter (HOSPITAL_COMMUNITY): Payer: Self-pay

## 2022-12-10 ENCOUNTER — Ambulatory Visit (HOSPITAL_COMMUNITY)
Admission: RE | Admit: 2022-12-10 | Discharge: 2022-12-10 | Disposition: A | Discharge status: Home / Self Care | Payer: BC Managed Care – PPO | Source: Ambulatory Visit | Attending: Internal Medicine | Admitting: Internal Medicine

## 2022-12-10 DIAGNOSIS — Z1231 Encounter for screening mammogram for malignant neoplasm of breast: Secondary | ICD-10-CM | POA: Insufficient documentation

## 2023-01-15 ENCOUNTER — Ambulatory Visit: Payer: BC Managed Care – PPO | Admitting: Family Medicine

## 2023-01-15 ENCOUNTER — Encounter: Payer: Self-pay | Admitting: Family Medicine

## 2023-01-15 VITALS — BP 124/82 | HR 92 | Ht 62.0 in | Wt 144.0 lb

## 2023-01-15 DIAGNOSIS — R52 Pain, unspecified: Secondary | ICD-10-CM

## 2023-01-15 DIAGNOSIS — Z136 Encounter for screening for cardiovascular disorders: Secondary | ICD-10-CM

## 2023-01-15 DIAGNOSIS — E559 Vitamin D deficiency, unspecified: Secondary | ICD-10-CM | POA: Diagnosis not present

## 2023-01-15 DIAGNOSIS — R1084 Generalized abdominal pain: Secondary | ICD-10-CM

## 2023-01-15 DIAGNOSIS — E039 Hypothyroidism, unspecified: Secondary | ICD-10-CM | POA: Diagnosis not present

## 2023-01-15 DIAGNOSIS — E538 Deficiency of other specified B group vitamins: Secondary | ICD-10-CM | POA: Diagnosis not present

## 2023-01-15 DIAGNOSIS — R7303 Prediabetes: Secondary | ICD-10-CM

## 2023-01-15 DIAGNOSIS — L299 Pruritus, unspecified: Secondary | ICD-10-CM

## 2023-01-15 DIAGNOSIS — R7301 Impaired fasting glucose: Secondary | ICD-10-CM

## 2023-01-15 MED ORDER — HYDROXYZINE PAMOATE 25 MG PO CAPS: 25.0000 mg | ORAL_CAPSULE | Freq: Three times a day (TID) | ORAL | 2 refills | Status: AC | PRN

## 2023-01-15 NOTE — Progress Notes (Unsigned)
   New Patient Office Visit   Subjective   Patient ID: Alyssa Walters, female    DOB: 08-07-1955  Age: 67 y.o. MRN: [id number]  CC:  Chief Complaint  Patient presents with   Establish Care    HPI [facility] presents to establish care. She  has a past medical history of Gallbladder stone without cholecystitis or obstruction, Kidney stone, Migraines, and Renal stone.  HPI    Outpatient Encounter Medications as of 01/15/2023  Medication Sig   acetaminophen (TYLENOL) 500 MG tablet Take 500 mg by mouth every 6 (six) hours as needed.   Aspirin-Acetaminophen-Caffeine 500-325-65 MG PACK Take 1 tablet by mouth daily as needed (pain.).   COD LIVER OIL/VITAMINS A & D PO Take 1 capsule by mouth in the morning.   Omega-3 Fatty Acids (FISH OIL) 1000 MG CAPS Take 1,000 mg by mouth daily with lunch.   vitamin E 180 MG (400 UNITS) capsule Take 400 Units by mouth daily with lunch.   pantoprazole (PROTONIX) 40 MG tablet Take 1 tablet (40 mg total) by mouth 2 (two) times daily before a meal.   rizatriptan (MAXALT) 10 MG tablet Take 10 mg by mouth as needed. (Patient not taking: Reported on 01/15/2023)   topiramate (TOPAMAX) 50 MG tablet Take 50 mg by mouth 2 (two) times daily. (Patient not taking: Reported on 01/15/2023)   No facility-administered encounter medications on file as of 01/15/2023.    Past Surgical History:  Procedure Laterality Date   BIOPSY  01/14/2021   Procedure: BIOPSY;  Surgeon: Lanelle Bal, DO;  [facility];  Service: Endoscopy;;   BREAST SURGERY Left    cyst drained   CESAREAN SECTION     CHOLECYSTECTOMY     COLONOSCOPY WITH PROPOFOL N/A 01/14/2021   Procedure: COLONOSCOPY WITH PROPOFOL;  Surgeon: Lanelle Bal, DO;  [facility];  Service: Endoscopy;  Laterality: N/A;  9:45am   ESOPHAGOGASTRODUODENOSCOPY (EGD) WITH PROPOFOL N/A 01/14/2021   Procedure: ESOPHAGOGASTRODUODENOSCOPY (EGD) WITH PROPOFOL;  Surgeon: Lanelle Bal, DO;  [facility];  Service: Endoscopy;  Laterality: N/A;   KIDNEY STONE SURGERY     LITHOTRIPSY     POLYPECTOMY  01/14/2021   Procedure: POLYPECTOMY;  Surgeon: Lanelle Bal, DO;  [facility];  Service: Endoscopy;;    ROS    Objective    BP 124/82   Pulse 92   Ht 5\' 2"  (1.575 m)   Wt 144 lb (65.3 kg)   SpO2 96%   BMI 26.34 kg/m   Physical Exam    Assessment & Plan:  There are no diagnoses linked to this encounter.  No follow-ups on file.   Cruzita Lederer Newman Nip, FNP

## 2023-01-15 NOTE — Patient Instructions (Signed)

## 2023-01-16 DIAGNOSIS — R109 Unspecified abdominal pain: Secondary | ICD-10-CM | POA: Insufficient documentation

## 2023-01-16 DIAGNOSIS — L299 Pruritus, unspecified: Secondary | ICD-10-CM | POA: Insufficient documentation

## 2023-01-16 NOTE — Assessment & Plan Note (Signed)
Trial on Hydroxyzine 25 mg PRN Referral placed to Allergy testing Patient reports  An ongoing urge to scratch, worsen at night started 6 months ago and worsening since onset. Skin noted dry and flaky

## 2023-01-16 NOTE — Assessment & Plan Note (Addendum)
Unknown etiology, hx of kidney stone, gallbladder stone Ordered abdominal CT for further evaluation  Increase oral fluid intake. Bland diet as tolerated. Avoid fluids that have a lot sugar or caffeine, Avoid spicy or fatty food. Avoid alcohol. Can take OTC tylenol for pain. Follow-up in unable to keep food/fluid down x 24 hours, dizziness, fevers, worsening or persistent symptoms to present to ED or contact primary care provider. Patient verbalizes understanding regarding plan of care and all questions answered.

## 2023-01-20 DIAGNOSIS — E559 Vitamin D deficiency, unspecified: Secondary | ICD-10-CM | POA: Diagnosis not present

## 2023-01-20 DIAGNOSIS — R52 Pain, unspecified: Secondary | ICD-10-CM | POA: Diagnosis not present

## 2023-01-20 DIAGNOSIS — R7303 Prediabetes: Secondary | ICD-10-CM | POA: Diagnosis not present

## 2023-01-20 DIAGNOSIS — E538 Deficiency of other specified B group vitamins: Secondary | ICD-10-CM | POA: Diagnosis not present

## 2023-01-20 DIAGNOSIS — E039 Hypothyroidism, unspecified: Secondary | ICD-10-CM | POA: Diagnosis not present

## 2023-01-20 DIAGNOSIS — Z136 Encounter for screening for cardiovascular disorders: Secondary | ICD-10-CM | POA: Diagnosis not present

## 2023-01-22 LAB — CMP14+EGFR
ALT: 17 [IU]/L (ref 0–32)
AST: 16 [IU]/L (ref 0–40)
Albumin: 4.6 g/dL (ref 3.9–4.9)
Alkaline Phosphatase: 100 [IU]/L (ref 44–121)
BUN/Creatinine Ratio: 12 (ref 12–28)
BUN: 10 mg/dL (ref 8–27)
Bilirubin Total: 0.4 mg/dL (ref 0.0–1.2)
CO2: 25 mmol/L (ref 20–29)
Calcium: 10.3 mg/dL (ref 8.7–10.3)
Chloride: 106 mmol/L (ref 96–106)
Creatinine, Ser: 0.84 mg/dL (ref 0.57–1.00)
Globulin, Total: 2 g/dL (ref 1.5–4.5)
Glucose: 93 mg/dL (ref 70–99)
Potassium: 4.5 mmol/L (ref 3.5–5.2)
Sodium: 143 mmol/L (ref 134–144)
Total Protein: 6.6 g/dL (ref 6.0–8.5)
eGFR: 76 mL/min/{1.73_m2} (ref >59)

## 2023-01-22 LAB — CBC WITH DIFFERENTIAL/PLATELET
Basophils Absolute: 0 10*3/uL (ref 0.0–0.2)
Basos: 1 %
EOS (ABSOLUTE): 0.1 10*3/uL (ref 0.0–0.4)
Eos: 3 %
Hematocrit: 43 % (ref 34.0–46.6)
Hemoglobin: 13.8 g/dL (ref 11.1–15.9)
Immature Grans (Abs): 0 10*3/uL (ref 0.0–0.1)
Immature Granulocytes: 0 %
Lymphocytes Absolute: 1.5 10*3/uL (ref 0.7–3.1)
Lymphs: 38 %
MCH: 28.4 pg (ref 26.6–33.0)
MCHC: 32.1 g/dL (ref 31.5–35.7)
MCV: 89 fL (ref 79–97)
Monocytes Absolute: 0.4 10*3/uL (ref 0.1–0.9)
Monocytes: 9 %
Neutrophils Absolute: 1.8 10*3/uL (ref 1.4–7.0)
Neutrophils: 49 %
Platelets: 242 10*3/uL (ref 150–450)
RBC: 4.86 x10E6/uL (ref 3.77–5.28)
RDW: 12.7 % (ref 11.7–15.4)
WBC: 3.9 10*3/uL (ref 3.4–10.8)

## 2023-01-22 LAB — TSH+FREE T4
Free T4: 1.56 ng/dL (ref 0.82–1.77)
TSH: 1.4 u[IU]/mL (ref 0.450–4.500)

## 2023-01-22 LAB — RHEUMATOID FACTOR: Rheumatoid fact SerPl-aCnc: 11.4 [IU]/mL (ref <14.0)

## 2023-01-22 LAB — LIPID PANEL
Chol/HDL Ratio: 3.1 {ratio} (ref 0.0–4.4)
Cholesterol, Total: 206 mg/dL — ABNORMAL HIGH (ref 100–199)
HDL: 67 mg/dL (ref >39)
LDL Chol Calc (NIH): 122 mg/dL — ABNORMAL HIGH (ref 0–99)
Triglycerides: 93 mg/dL (ref 0–149)
VLDL Cholesterol Cal: 17 mg/dL (ref 5–40)

## 2023-01-22 LAB — MICROALBUMIN / CREATININE URINE RATIO
Creatinine, Urine: 71 mg/dL
Microalb/Creat Ratio: <4 mg/g{creat} (ref 0–29)
Microalbumin, Urine: <3 ug/mL

## 2023-01-22 LAB — ANA W/REFLEX: Anti Nuclear Antibody (ANA): NEGATIVE

## 2023-01-22 LAB — HEMOGLOBIN A1C
Est. average glucose Bld gHb Est-mCnc: 114 mg/dL
Hgb A1c MFr Bld: 5.6 % (ref 4.8–5.6)

## 2023-01-22 LAB — VITAMIN B12: Vitamin B-12: 781 pg/mL (ref 232–1245)

## 2023-01-22 LAB — VITAMIN D 25 HYDROXY (VIT D DEFICIENCY, FRACTURES): Vit D, 25-Hydroxy: 66.8 ng/mL (ref 30.0–100.0)

## 2023-01-22 NOTE — Progress Notes (Signed)
Hello Alyssa Walters, patient would like labs results to be mailed to her.   All labs normal, Negative for autoimmune diseases except for LDL levels. Cholesterol levels elevated, advise lifestyle modifications follow diet low in saturated fat, reduce dietary salt intake, avoid fatty foods, maintain an exercise routine 3 to 5 days a week for a minimum total of 150 minutes.    We will wait on Abdominal CT scan for further explanation of her pain.

## 2023-01-23 ENCOUNTER — Ambulatory Visit (HOSPITAL_COMMUNITY): Payer: BC Managed Care – PPO

## 2023-02-01 ENCOUNTER — Ambulatory Visit (HOSPITAL_COMMUNITY)
Admission: RE | Admit: 2023-02-01 | Discharge: 2023-02-01 | Disposition: A | Discharge status: Home / Self Care | Payer: BC Managed Care – PPO | Source: Ambulatory Visit | Attending: Family Medicine | Admitting: Family Medicine

## 2023-02-01 DIAGNOSIS — R1084 Generalized abdominal pain: Secondary | ICD-10-CM | POA: Diagnosis not present

## 2023-02-01 DIAGNOSIS — R932 Abnormal findings on diagnostic imaging of liver and biliary tract: Secondary | ICD-10-CM | POA: Diagnosis not present

## 2023-02-03 ENCOUNTER — Encounter: Payer: Self-pay | Admitting: Family Medicine

## 2023-02-03 ENCOUNTER — Ambulatory Visit: Payer: BC Managed Care – PPO | Admitting: Family Medicine

## 2023-02-03 VITALS — BP 131/83 | HR 77 | Ht 62.0 in | Wt 144.1 lb

## 2023-02-03 DIAGNOSIS — S0083XA Contusion of other part of head, initial encounter: Secondary | ICD-10-CM | POA: Diagnosis not present

## 2023-02-03 DIAGNOSIS — W01119A Fall on same level from slipping, tripping and stumbling with subsequent striking against unspecified sharp object, initial encounter: Secondary | ICD-10-CM | POA: Diagnosis not present

## 2023-02-03 NOTE — Patient Instructions (Signed)

## 2023-02-03 NOTE — Progress Notes (Signed)
Patient Office Visit   Subjective   Patient ID: Alyssa Walters, female    DOB: Feb 29, 1956  Age: 67 y.o. MRN: [id number]  CC:  Chief Complaint  Patient presents with   Follow-up    Early morning fall. Slipped getting back into tall bed. ,hit head on corner of night table.No abrasion or broken skin. There was a knot but,  Knot has dissipated, however, head is still sore to the touch, with neck pain.  Pt . States she is having Pain when focusing with the eye on the side of the head where the hit happened.    HPI Alyssa Walters 67 year old female,  presents to the clinic after sustaining a fall earlier this morning, during which she hit her head on the corner of a night table. She is now experiencing worsening headache, right eye, neck pain, blurred vision, and confusion. She  has a past medical history of Gallbladder stone without cholecystitis or obstruction, Kidney stone, Migraines, and Renal stone.  Fall Incident  The patient reports a fall that occurred 12 to 24 hours ago. The fall took place as she rolled out of bed, although the exact height of the fall is unknown. She hit her right occipital region of her head on the corner of a night table, which caused minimal blood loss. The primary area of impact was the head, and she now experiences significant head pain, rated at 9/10 in severity. The pain is described as moderate but becomes more intense with standing and movement. In addition to the head pain, she reports associated symptoms such as headaches, numbness, and changes in her vision. She also mentions the onset of neck pain following the fall. There was no loss of consciousness at the time of the incident. She has tried acetaminophen to manage the pain, which has provided moderate relief. However, her symptoms persist and worsen with activity.       Outpatient Encounter Medications as of 02/03/2023  Medication Sig   acetaminophen (TYLENOL) 500 MG tablet Take 500 mg by mouth every 6  (six) hours as needed.   Aspirin-Acetaminophen-Caffeine 500-325-65 MG PACK Take 1 tablet by mouth daily as needed (pain.).   folic acid-vitamin b complex-vitamin c-selenium-zinc (DIALYVITE) 3 MG TABS tablet Take 1 tablet by mouth daily.   hydrOXYzine (VISTARIL) 25 MG capsule Take 1 capsule (25 mg total) by mouth every 8 (eight) hours as needed.   vitamin B-12 (CYANOCOBALAMIN) 100 MCG tablet Take 100 mcg by mouth daily.   vitamin E 180 MG (400 UNITS) capsule Take 400 Units by mouth daily with lunch.   COD LIVER OIL/VITAMINS A & D PO Take 1 capsule by mouth in the morning. (Patient not taking: Reported on 02/03/2023)   Omega-3 Fatty Acids (FISH OIL) 1000 MG CAPS Take 1,000 mg by mouth daily with lunch. (Patient not taking: Reported on 02/03/2023)   pantoprazole (PROTONIX) 40 MG tablet Take 1 tablet (40 mg total) by mouth 2 (two) times daily before a meal.   No facility-administered encounter medications on file as of 02/03/2023.    Past Surgical History:  Procedure Laterality Date   BIOPSY  01/14/2021   Procedure: BIOPSY;  Surgeon: Lanelle Bal, DO;  Location: [facility];  Service: Endoscopy;;   BREAST SURGERY Left    cyst drained   CESAREAN SECTION     CHOLECYSTECTOMY     COLONOSCOPY WITH PROPOFOL N/A 01/14/2021   Procedure: COLONOSCOPY WITH PROPOFOL;  Surgeon: Lanelle Bal, DO;  Location:  [facility];  Service: Endoscopy;  Laterality: N/A;  9:45am   ESOPHAGOGASTRODUODENOSCOPY (EGD) WITH PROPOFOL N/A 01/14/2021   Procedure: ESOPHAGOGASTRODUODENOSCOPY (EGD) WITH PROPOFOL;  Surgeon: Lanelle Bal, DO;  Location: [facility];  Service: Endoscopy;  Laterality: N/A;   KIDNEY STONE SURGERY     LITHOTRIPSY     POLYPECTOMY  01/14/2021   Procedure: POLYPECTOMY;  Surgeon: Lanelle Bal, DO;  Location: [facility];  Service: Endoscopy;;    Review of Systems  Constitutional:  Negative for chills and fever.  Eyes:  Positive for blurred vision, double vision, photophobia and  pain. Negative for redness.  Respiratory:  Negative for shortness of breath.   Cardiovascular:  Negative for chest pain.  Neurological:  Positive for dizziness and headaches.      Objective    BP 131/83   Pulse 77   Ht 5\' 2"  (1.575 m)   Wt 144 lb 1.3 oz (65.4 kg)   SpO2 93%   BMI 26.35 kg/m   Physical Exam Vitals reviewed.  Constitutional:      General: She is not in acute distress.    Appearance: Normal appearance. She is not ill-appearing, toxic-appearing or diaphoretic.  HENT:     Head: Normocephalic.  Eyes:     General:        Right eye: No discharge.        Left eye: No discharge.     Conjunctiva/sclera: Conjunctivae normal.     Pupils: Pupils are equal, round, and reactive to light.  Neck:     Comments:  The skin over the occipital region of the head is intact, with no signs of bleeding or redness Cardiovascular:     Rate and Rhythm: Normal rate.     Pulses: Normal pulses.     Heart sounds: Normal heart sounds.  Pulmonary:     Effort: Pulmonary effort is normal. No respiratory distress.     Breath sounds: Normal breath sounds.  Abdominal:     Tenderness: There is no abdominal tenderness. There is no left CVA tenderness.  Musculoskeletal:        General: Normal range of motion.     Cervical back: Neck supple. Tenderness present. No erythema. Pain with movement present.  Skin:    General: Skin is warm and dry.     Capillary Refill: Capillary refill takes less than 2 seconds.  Neurological:     Mental Status: She is alert and oriented to person, place, and time.     Coordination: Coordination normal.     Gait: Gait normal.  Psychiatric:        Mood and Affect: Mood normal.       Assessment & Plan:  Fall resulting in striking against sharp object, initial encounter Assessment & Plan: CT Head ordered I advised the patient to visit the emergency department for immediate evaluation for worsening symptoms. In the meantime, they can take Tylenol for pain  relief, apply ice to the affected are, and rest in bed. Additionally, I encouraged the patient to be cautious while walking to prevent further falls  Orders: -     CT HEAD WO CONTRAST ( ); Future    Return if symptoms worsen or fail to improve.   Cruzita Lederer Newman Nip, FNP

## 2023-02-03 NOTE — Assessment & Plan Note (Signed)
CT Head ordered I advised the patient to visit the emergency department for immediate evaluation for worsening symptoms. In the meantime, they can take Tylenol for pain relief, apply ice to the affected are, and rest in bed. Additionally, I encouraged the patient to be cautious while walking to prevent further falls

## 2023-02-26 NOTE — Progress Notes (Signed)
Hi Alyssa Walters,  Spanish speaking patient please inform and mail results.   CT scan did not show any findings to explain the pain you are experiencing, which is reassuring because it rules out any serious or life-threatening conditions. However, the scan did reveal small nodules in your lungs, measuring up to 5 millimeters in size. These are most likely harmless lymph nodes, and no further testing is needed if you are at low risk for lung problems, such as not having a history of cancer or smoking. If you are considered higher risk, we might recommend a follow-up chest CT in about 12 months to monitor for any changes, but at this time, there is no immediate cause for concern.  The scan also identified some hardening of the walls of your aorta, the large blood vessel that carries blood from your heart to the rest of your body. This condition, known as aortic atherosclerosis, is common as people age and can be linked to factors like high blood pressure, high cholesterol, or smoking. It's important to focus on lifestyle changes to manage this, including eating a healthy diet, staying physically active, and possibly taking medications if needed to control  cholesterol.

## 2023-11-04 ENCOUNTER — Encounter: Payer: Self-pay | Admitting: Family Medicine

## 2023-11-04 ENCOUNTER — Ambulatory Visit (HOSPITAL_COMMUNITY)
Admission: RE | Admit: 2023-11-04 | Discharge: 2023-11-04 | Disposition: A | Discharge status: Home / Self Care | Source: Ambulatory Visit | Attending: Family Medicine | Admitting: Family Medicine

## 2023-11-04 ENCOUNTER — Ambulatory Visit: Payer: Self-pay | Admitting: Family Medicine

## 2023-11-04 VITALS — BP 111/75 | HR 83 | Resp 16 | Ht 62.0 in | Wt 138.1 lb

## 2023-11-04 DIAGNOSIS — G8929 Other chronic pain: Secondary | ICD-10-CM

## 2023-11-04 DIAGNOSIS — M25511 Pain in right shoulder: Secondary | ICD-10-CM

## 2023-11-04 DIAGNOSIS — M542 Cervicalgia: Secondary | ICD-10-CM

## 2023-11-04 DIAGNOSIS — M19011 Primary osteoarthritis, right shoulder: Secondary | ICD-10-CM | POA: Diagnosis not present

## 2023-11-04 DIAGNOSIS — R200 Anesthesia of skin: Secondary | ICD-10-CM | POA: Diagnosis not present

## 2023-11-04 DIAGNOSIS — R079 Chest pain, unspecified: Secondary | ICD-10-CM

## 2023-11-04 DIAGNOSIS — G4486 Cervicogenic headache: Secondary | ICD-10-CM

## 2023-11-04 MED ORDER — KETOROLAC TROMETHAMINE 60 MG/2ML IM SOLN
60.0000 mg | Freq: Once | INTRAMUSCULAR | Status: AC
  Administered 2023-11-04: 30 mg via INTRAMUSCULAR

## 2023-11-04 MED ORDER — PREDNISONE 5 MG PO TABS: ORAL_TABLET | ORAL | 0 refills | Status: DC

## 2023-11-04 MED ORDER — METHYLPREDNISOLONE ACETATE 80 MG/ML IJ SUSP
80.0000 mg | Freq: Once | INTRAMUSCULAR | Status: AC
  Administered 2023-11-04: 40 mg via INTRAMUSCULAR

## 2023-11-04 NOTE — Assessment & Plan Note (Addendum)
 Neck pain radiaiting to right hand with numbness x 3 weeks . S/p fall in 06/2023 X ray,  Uncontrolled.Toradol  and depo medrol  administered IM in the office , to be followed by a short course of oral prednisone  and Ortho eval.

## 2023-11-04 NOTE — Patient Instructions (Addendum)
 Keep f/u with pCP as before  Pls clearly put on pt's chart as her contact info for everything her daughter who is with her today and the contact info for the daughter  Pls get Xray of neck and right shoulder today  You are referred to Orthopedics re neck and right shoulder appointment in 2 to 3 weeks requested  Toradol  30 mg IM and depo medrol  40 mg IM in office today for neck and shoulder pain, also there is a short course of prednisone  prescribed for you to start today for the pain  The EKG done   today is no different from previous one several years ago, I recommend taking heartburn / reflux medication daily as was prescribed in the past   Thanks for choosing [facility], we consider it a privelige to serve you.

## 2023-11-04 NOTE — Progress Notes (Signed)
   Alyssa Walters     MRN: [id number]      DOB: 07-05-1955  Chief Complaint  Patient presents with   Arm Pain    Pt complains of rt arm and hand numbness and weakness for about a year but has gotten significantly worse in the last 4 weeks. Also complains of chest pain that comes and goes    Headache    Pt also complains of intermittent right sided headache since a fall a couple months ago. Occasional dizziness as well since then     HPI Alyssa Walters is here with above complaints, symptoms aggravated by a fall approximately 4 months ago   ROS Denies recent fever or chills. Denies sinus pressure, nasal congestion, ear pain or sore throat. Denies chest congestion, productive cough or wheezing. Denies chest pains, palpitations and leg swelling Denies abdominal pain, nausea, vomiting,diarrhea or constipation.   Denies dysuria, frequency, hesitancy or incontinence. Denies depression, c/o anxiety denies  insomnia. Denies skin break down or rash.   PE  BP 111/75   Pulse 83   Resp 16   Ht 5' 2 (1.575 m)   Wt 138 lb 1.9 oz (62.7 kg)   SpO2 96%   BMI 25.26 kg/m   Patient alert and oriented and in no cardiopulmonary distress.  HEENT: No facial asymmetry, EOMI,     Neck decreased ROM.  Chest: Clear to auscultation bilaterally.  CVS: S1, S2 no murmurs, no S3.Regular rate.  ABD: Soft non tender.   Ext: No edema  MS: Adequate ROM spine, hips and knees , decreased in right shoulder  Skin: Intact, no ulcerations or rash noted.  Psych: Good eye contact, normal affect. Memory intact not anxious or depressed appearing.  CNS: CN 2-12 intact, power,  normal throughout.no focal deficits noted.   Assessment & Plan  Neck pain on right side Neck pain radiaiting to right hand with numbness x 3 weeks . S/p fall in 06/2023 X ray,  Uncontrolled.Toradol  and depo medrol  administered IM in the office , to be followed by a short course of oral prednisone  and Ortho eval.   Shoulder pain,  right Uncontrolled.Toradol  and depo medrol  administered IM in the office , to be followed by a short course of oral prednisone  X ray of shoulder and Ortho referral  Headache Chronic right sided , neuro exam grossly normal no deficits noted or reported, likely cervicogenic I recommend tylenol and she is also getting short sharp course of ant inflammatory med Call for worsening symptoms , advised ED  eval fif worsening

## 2023-11-12 ENCOUNTER — Ambulatory Visit: Payer: Self-pay | Admitting: Family Medicine

## 2023-12-12 ENCOUNTER — Encounter: Payer: Self-pay | Admitting: Family Medicine

## 2023-12-12 DIAGNOSIS — R519 Headache, unspecified: Secondary | ICD-10-CM | POA: Insufficient documentation

## 2023-12-12 NOTE — Assessment & Plan Note (Signed)
 Uncontrolled.Toradol  and depo medrol  administered IM in the office , to be followed by a short course of oral prednisone  X ray of shoulder and Ortho referral

## 2023-12-12 NOTE — Assessment & Plan Note (Signed)
 Chronic right sided , neuro exam grossly normal no deficits noted or reported, likely cervicogenic I recommend tylenol and she is also getting short sharp course of ant inflammatory med Call for worsening symptoms , advised ED  eval fif worsening

## 2023-12-31 ENCOUNTER — Encounter: Admitting: Nurse Practitioner

## 2023-12-31 ENCOUNTER — Encounter: Payer: Self-pay | Admitting: Family Medicine

## 2024-01-20 ENCOUNTER — Encounter: Admitting: Nurse Practitioner

## 2024-04-26 ENCOUNTER — Ambulatory Visit: Payer: Self-pay

## 2024-04-26 VITALS — BP 123/78 | HR 83 | Resp 16 | Ht 62.0 in | Wt 147.0 lb

## 2024-04-26 DIAGNOSIS — M25511 Pain in right shoulder: Secondary | ICD-10-CM

## 2024-04-26 DIAGNOSIS — R7301 Impaired fasting glucose: Secondary | ICD-10-CM | POA: Diagnosis not present

## 2024-04-26 DIAGNOSIS — Z0001 Encounter for general adult medical examination with abnormal findings: Secondary | ICD-10-CM | POA: Diagnosis not present

## 2024-04-26 DIAGNOSIS — E559 Vitamin D deficiency, unspecified: Secondary | ICD-10-CM | POA: Diagnosis not present

## 2024-04-26 DIAGNOSIS — R918 Other nonspecific abnormal finding of lung field: Secondary | ICD-10-CM

## 2024-04-26 NOTE — Progress Notes (Signed)
 "  Complete physical exam  Patient: Alyssa Walters   DOB: September 13, 1955   69 y.o. Female  MRN: [id number]  Subjective:    Chief Complaint  Patient presents with   Annual Exam   Shoulder Pain    Still having pain in her right shoulder. Rates pain    Knee Pain    Both knees have been hurting her and thinks they may be a little swollen but does work on her feet all day     Alyssa Walters is a 69 y.o. female who presents today for a complete physical exam. She reports consuming a general diet. The patient has a physically strenuous job, but has no regular exercise apart from work.  She generally feels well. She reports sleeping well. She does not have additional problems to discuss today.    Most recent fall risk assessment:    04/26/2024    4:09 PM  Fall Risk   Falls in the past year? 1  Number falls in past yr: 0  Injury with Fall? 0  Risk for fall due to : No Fall Risks  Follow up Falls evaluation completed;Education provided;Falls prevention discussed     Most recent depression screenings:    04/26/2024    4:10 PM 02/03/2023    3:45 PM  PHQ 2/9 Scores  PHQ - 2 Score 0 1  PHQ- 9 Score  3      Data saved with a previous flowsheet row definition      Patient Active Problem List   Diagnosis Date Noted   Vitamin D  deficiency 05/01/2024   IFG (impaired fasting glucose) 05/01/2024   Multiple nodules of lung 05/01/2024   Headache 12/12/2023   Neck pain on right side 11/04/2023   Shoulder pain, right 11/04/2023   Chest pain 11/04/2023   Fall against sharp object 02/03/2023   Chronic pruritus 01/16/2023   Abdominal pain 01/16/2023   Right sided abdominal pain 12/31/2020   GERD (gastroesophageal reflux disease) 12/31/2020      Patient Care Team: Del Wilhelmena Lloyd Sola, FNP as PCP - General (Family Medicine) Cindie Carlin POUR, DO as Consulting Physician (Gastroenterology)   Show/hide medication list[1]  ROS        Objective:     BP 123/78   Pulse 83   Resp 16    Ht 5' 2 (1.575 m)   Wt 147 lb (66.7 kg)   SpO2 94%   BMI 26.89 kg/m  BP Readings from Last 3 Encounters:  04/26/24 123/78  11/04/23 111/75  02/03/23 131/83   Wt Readings from Last 3 Encounters:  04/26/24 147 lb (66.7 kg)  11/04/23 138 lb 1.9 oz (62.7 kg)  02/03/23 144 lb 1.3 oz (65.4 kg)   Physical Exam Vitals and nursing note reviewed.  Constitutional:      Appearance: Normal appearance.  HENT:     Head: Normocephalic.     Right Ear: Tympanic membrane, ear canal and external ear normal.     Left Ear: Tympanic membrane, ear canal and external ear normal.     Nose: Nose normal.     Mouth/Throat:     Mouth: Mucous membranes are moist.     Pharynx: Oropharynx is clear.  Eyes:     Extraocular Movements: Extraocular movements intact.     Conjunctiva/sclera: Conjunctivae normal.     Pupils: Pupils are equal, round, and reactive to light.  Cardiovascular:     Rate and Rhythm: Normal rate and regular rhythm.  Pulmonary:  Effort: Pulmonary effort is normal.     Breath sounds: Normal breath sounds.  Abdominal:     General: Bowel sounds are normal.     Palpations: Abdomen is soft.  Musculoskeletal:        General: Normal range of motion.     Cervical back: Normal range of motion and neck supple.  Skin:    General: Skin is warm and dry.  Neurological:     Mental Status: She is alert and oriented to person, place, and time.  Psychiatric:        Mood and Affect: Mood normal.        Thought Content: Thought content normal.      No results found for any visits on 04/26/24.     Assessment & Plan:    Routine Health Maintenance and Physical Exam  Immunization History  Administered Date(s) Administered   Tdap 12/21/2017    Health Maintenance  Topic Date Due   Pneumococcal Vaccine: 50+ Years (1 of 1 - PCV) Never done   Zoster Vaccines- Shingrix (1 of 2) Never done   Bone Density Scan  Never done   COVID-19 Vaccine (1 - 2025-26 season) Never done   Influenza  Vaccine  07/11/2024 (Originally 11/12/2023)   Mammogram  12/09/2024   DTaP/Tdap/Td (2 - Td or Tdap) 12/22/2027   Colonoscopy  01/15/2031   Hepatitis C Screening  Completed   Meningococcal B Vaccine  Aged Out    Discussed health benefits of physical activity, and encouraged her to engage in regular exercise appropriate for her age and condition.  Problem List Items Addressed This Visit       Respiratory   Multiple nodules of lung   Non-concerning nodules due to non-smoking status. Follow-up imaging needed. - Ordered CT scan to follow up on lung nodules.      Relevant Orders   CT Chest Wo Contrast     Endocrine   IFG (impaired fasting glucose)   Routine check of fasting blood glucose levels needed. - Ordered fasting blood glucose test.        Other   Shoulder pain, right - Primary   Chronic pain worsened by recent fall, previously improved with cortisone injection. - Resubmitted orthopedic referral. - Provided orthopedic contact information.      Relevant Orders   Ambulatory referral to Orthopedic Surgery   Vitamin D  deficiency   Vitamin D  levels to be checked routinely. - Ordered blood test to check vitamin D  levels.      Relevant Orders   Vitamin D  (25 hydroxy)   Other Visit Diagnoses       Encounter for preventative adult health care exam with abnormal findings       Routine physical exam.   - Ordered fasting blood tests for cholesterol, blood sugar, kidney, liver, and thyroid  function.   Relevant Orders   CMP14+EGFR   Lipid Profile   CBC   TSH + free T4   Vitamin D  (25 hydroxy)   HgB A1c       No follow-ups on file.     Leita Longs, FNP      [1]  Outpatient Medications Prior to Visit  Medication Sig   acetaminophen (TYLENOL) 500 MG tablet Take 500 mg by mouth every 6 (six) hours as needed.   Aspirin-Acetaminophen-Caffeine 500-325-65 MG PACK Take 1 tablet by mouth daily as needed (pain.).   COD LIVER OIL/VITAMINS A & D PO Take 1 capsule by  mouth in the morning.  folic acid -vitamin b complex-vitamin c-selenium-zinc (DIALYVITE) 3 MG TABS tablet Take 1 tablet by mouth daily.   hydrOXYzine  (VISTARIL ) 25 MG capsule Take 1 capsule (25 mg total) by mouth every 8 (eight) hours as needed.   Omega-3 Fatty Acids (FISH OIL) 1000 MG CAPS Take 1,000 mg by mouth daily with lunch.   pantoprazole  (PROTONIX ) 40 MG tablet Take 1 tablet (40 mg total) by mouth 2 (two) times daily before a meal.   vitamin B-12 (CYANOCOBALAMIN ) 100 MCG tablet Take 100 mcg by mouth daily.   vitamin E 180 MG (400 UNITS) capsule Take 400 Units by mouth daily with lunch.   [DISCONTINUED] predniSONE  (DELTASONE ) 5 MG tablet Take one tablet by mouth three times daily for 2 days, then one tablet twice daily for 2 days, then one tablet once daily for 2 days, then stop   No facility-administered medications prior to visit.   "

## 2024-05-01 DIAGNOSIS — R918 Other nonspecific abnormal finding of lung field: Secondary | ICD-10-CM | POA: Insufficient documentation

## 2024-05-01 DIAGNOSIS — R7301 Impaired fasting glucose: Secondary | ICD-10-CM | POA: Insufficient documentation

## 2024-05-01 DIAGNOSIS — E559 Vitamin D deficiency, unspecified: Secondary | ICD-10-CM | POA: Insufficient documentation

## 2024-05-01 NOTE — Assessment & Plan Note (Signed)
 Routine check of fasting blood glucose levels needed. - Ordered fasting blood glucose test.

## 2024-05-01 NOTE — Assessment & Plan Note (Signed)
 Non-concerning nodules due to non-smoking status. Follow-up imaging needed. - Ordered CT scan to follow up on lung nodules.

## 2024-05-01 NOTE — Assessment & Plan Note (Signed)
 Chronic pain worsened by recent fall, previously improved with cortisone injection. - Resubmitted orthopedic referral. - Provided orthopedic contact information.

## 2024-05-01 NOTE — Assessment & Plan Note (Signed)
 Vitamin D  levels to be checked routinely. - Ordered blood test to check vitamin D  levels.

## 2024-05-04 ENCOUNTER — Other Ambulatory Visit: Payer: Self-pay

## 2024-05-04 DIAGNOSIS — R7301 Impaired fasting glucose: Secondary | ICD-10-CM

## 2024-05-05 LAB — LIPID PANEL
Chol/HDL Ratio: 3.1 ratio (ref 0.0–4.4)
Cholesterol, Total: 214 mg/dL — ABNORMAL HIGH (ref 100–199)
HDL: 70 mg/dL
LDL Chol Calc (NIH): 131 mg/dL — ABNORMAL HIGH (ref 0–99)
Triglycerides: 73 mg/dL (ref 0–149)
VLDL Cholesterol Cal: 13 mg/dL (ref 5–40)

## 2024-05-05 LAB — CMP14+EGFR
ALT: 19 IU/L (ref 0–32)
AST: 19 IU/L (ref 0–40)
Albumin: 4.6 g/dL (ref 3.9–4.9)
Alkaline Phosphatase: 110 IU/L (ref 49–135)
BUN/Creatinine Ratio: 16 (ref 12–28)
BUN: 12 mg/dL (ref 8–27)
Bilirubin Total: 0.4 mg/dL (ref 0.0–1.2)
CO2: 23 mmol/L (ref 20–29)
Calcium: 10.5 mg/dL — ABNORMAL HIGH (ref 8.7–10.3)
Chloride: 105 mmol/L (ref 96–106)
Creatinine, Ser: 0.73 mg/dL (ref 0.57–1.00)
Globulin, Total: 2.5 g/dL (ref 1.5–4.5)
Glucose: 94 mg/dL (ref 70–99)
Potassium: 4.6 mmol/L (ref 3.5–5.2)
Sodium: 142 mmol/L (ref 134–144)
Total Protein: 7.1 g/dL (ref 6.0–8.5)
eGFR: 90 mL/min/1.73

## 2024-05-05 LAB — VITAMIN D 25 HYDROXY (VIT D DEFICIENCY, FRACTURES): Vit D, 25-Hydroxy: 39.6 ng/mL (ref 30.0–100.0)

## 2024-05-05 LAB — CBC
Hematocrit: 44 % (ref 34.0–46.6)
Hemoglobin: 14 g/dL (ref 11.1–15.9)
MCH: 27.7 pg (ref 26.6–33.0)
MCHC: 31.8 g/dL (ref 31.5–35.7)
MCV: 87 fL (ref 79–97)
Platelets: 292 x10E3/uL (ref 150–450)
RBC: 5.05 x10E6/uL (ref 3.77–5.28)
RDW: 12.6 % (ref 11.7–15.4)
WBC: 4.5 x10E3/uL (ref 3.4–10.8)

## 2024-05-05 LAB — HEMOGLOBIN A1C
Est. average glucose Bld gHb Est-mCnc: 117 mg/dL
Hgb A1c MFr Bld: 5.7 % — ABNORMAL HIGH (ref 4.8–5.6)

## 2024-05-05 LAB — TSH+FREE T4
Free T4: 1.53 ng/dL (ref 0.82–1.77)
TSH: 1.16 u[IU]/mL (ref 0.450–4.500)

## 2024-05-06 LAB — MICROALBUMIN / CREATININE URINE RATIO
Creatinine, Urine: 133.6 mg/dL
Microalb/Creat Ratio: 6 mg/g{creat} (ref 0–29)
Microalbumin, Urine: 7.6 ug/mL

## 2024-05-19 ENCOUNTER — Ambulatory Visit (HOSPITAL_COMMUNITY): Admission: RE | Admit: 2024-05-19 | Source: Ambulatory Visit

## 2024-05-19 DIAGNOSIS — R918 Other nonspecific abnormal finding of lung field: Secondary | ICD-10-CM
